# Patient Record
Sex: Female | Born: 1975 | Race: Black or African American | Hispanic: No | Marital: Married | Smoking: Never smoker
Health system: Southern US, Community
[De-identification: ages and names within clinical notes are randomized; demographics above are authoritative.]

## PROBLEM LIST (undated history)

## (undated) ENCOUNTER — Emergency Department (HOSPITAL_BASED_OUTPATIENT_CLINIC_OR_DEPARTMENT_OTHER): Payer: Self-pay

## (undated) DIAGNOSIS — D649 Anemia, unspecified: Secondary | ICD-10-CM

## (undated) DIAGNOSIS — B9689 Other specified bacterial agents as the cause of diseases classified elsewhere: Secondary | ICD-10-CM

## (undated) DIAGNOSIS — N920 Excessive and frequent menstruation with regular cycle: Secondary | ICD-10-CM

## (undated) DIAGNOSIS — N760 Acute vaginitis: Secondary | ICD-10-CM

## (undated) DIAGNOSIS — B009 Herpesviral infection, unspecified: Secondary | ICD-10-CM

## (undated) HISTORY — PX: TUBAL LIGATION: SHX77

## (undated) HISTORY — DX: Anemia, unspecified: D64.9

## (undated) HISTORY — DX: Other specified bacterial agents as the cause of diseases classified elsewhere: B96.89

## (undated) HISTORY — DX: Acute vaginitis: N76.0

## (undated) HISTORY — DX: Herpesviral infection, unspecified: B00.9

## (undated) HISTORY — DX: Excessive and frequent menstruation with regular cycle: N92.0

---

## 2012-06-09 ENCOUNTER — Telehealth: Payer: Self-pay | Admitting: Hematology & Oncology

## 2012-06-09 NOTE — Telephone Encounter (Signed)
Pt aware of 06-17-12 appointment

## 2012-06-12 ENCOUNTER — Other Ambulatory Visit: Payer: Self-pay

## 2012-06-17 ENCOUNTER — Ambulatory Visit: Payer: Self-pay | Admitting: Medical

## 2012-06-17 ENCOUNTER — Telehealth: Payer: Self-pay | Admitting: Hematology & Oncology

## 2012-06-17 ENCOUNTER — Ambulatory Visit: Payer: Self-pay

## 2012-06-17 ENCOUNTER — Other Ambulatory Visit: Payer: Self-pay | Admitting: Lab

## 2012-06-17 NOTE — Telephone Encounter (Signed)
Pt stated she called adult health to reaffirm appointment with [place] today. She said they told her she didn't have appointment. Pt aware of 06-24-12 appointment. She had all info from VM when she booked 06-17-12 appointnment

## 2012-06-24 ENCOUNTER — Other Ambulatory Visit: Payer: Self-pay | Admitting: Lab

## 2012-06-24 ENCOUNTER — Ambulatory Visit: Payer: Self-pay

## 2012-06-24 ENCOUNTER — Ambulatory Visit (HOSPITAL_BASED_OUTPATIENT_CLINIC_OR_DEPARTMENT_OTHER): Payer: Self-pay | Admitting: Medical

## 2012-06-24 VITALS — BP 122/64 | HR 96 | Temp 97.4°F | Resp 20 | Ht 61.0 in | Wt 231.0 lb

## 2012-06-24 DIAGNOSIS — N920 Excessive and frequent menstruation with regular cycle: Secondary | ICD-10-CM

## 2012-06-24 DIAGNOSIS — D509 Iron deficiency anemia, unspecified: Secondary | ICD-10-CM

## 2012-06-24 LAB — CBC WITH DIFFERENTIAL (CANCER CENTER ONLY)
BASO%: 0.2 % (ref 0.0–2.0)
Eosinophils Absolute: 0.1 10*3/uL (ref 0.0–0.5)
MCH: 21.6 pg — ABNORMAL LOW (ref 26.0–34.0)
MONO%: 7.8 % (ref 0.0–13.0)
NEUT#: 3.2 10*3/uL (ref 1.5–6.5)
Platelets: 450 10*3/uL — ABNORMAL HIGH (ref 145–400)
RBC: 2.91 10*6/uL — ABNORMAL LOW (ref 3.70–5.32)
WBC: 5 10*3/uL (ref 3.9–10.0)

## 2012-06-24 LAB — CHCC SATELLITE - SMEAR

## 2012-06-24 MED ORDER — SODIUM CHLORIDE 0.9 % IV SOLN
1020.0000 mg | Freq: Once | INTRAVENOUS | Status: DC
  Filled 2012-06-24: qty 34

## 2012-06-25 ENCOUNTER — Ambulatory Visit: Payer: Self-pay

## 2012-06-25 DIAGNOSIS — D509 Iron deficiency anemia, unspecified: Secondary | ICD-10-CM

## 2012-06-25 MED ORDER — SODIUM CHLORIDE 0.9 % IV SOLN
1020.0000 mg | Freq: Once | INTRAVENOUS | Status: DC
  Filled 2012-06-25: qty 34

## 2012-06-25 MED ORDER — SODIUM CHLORIDE 0.9 % IV SOLN: Freq: Once | INTRAVENOUS | Status: DC

## 2012-06-25 NOTE — Progress Notes (Signed)
06/25/2012 Iron infusion postponed due to lack of insurance coverage.  Wille Glaser spoke with patient at length, approximate cost $400.00.  Patient is going to speak with her insurance company and reschedule with Wille Glaser. Teola Bradley, Burhanuddin Kohlmann [employer]

## 2012-06-26 LAB — IRON AND TIBC
%SAT: 3 % — ABNORMAL LOW (ref 20–55)
Iron: 13 ug/dL — ABNORMAL LOW (ref 42–145)
TIBC: 386 ug/dL (ref 250–470)
UIBC: 373 ug/dL (ref 125–400)

## 2012-06-26 LAB — RETICULOCYTES (CHCC)
RBC.: 3.07 MIL/uL — ABNORMAL LOW (ref 3.87–5.11)
Retic Ct Pct: 1.1 % (ref 0.4–2.3)

## 2012-06-30 DIAGNOSIS — D509 Iron deficiency anemia, unspecified: Secondary | ICD-10-CM | POA: Insufficient documentation

## 2012-06-30 DIAGNOSIS — N920 Excessive and frequent menstruation with regular cycle: Secondary | ICD-10-CM | POA: Insufficient documentation

## 2012-06-30 NOTE — Progress Notes (Signed)
This office note has been dictated.

## 2012-06-30 NOTE — H&P (Signed)
[facility] NEW PATIENT EVALUATION   Name: Sara Porter Date: 06/30/2012 MRN: [id number] DOB: [birth year]    REFERRING PHYSICIAN:  Augustine Radar, FNP-C  REASON FOR REFERRAL: Iron deficiency anemia   HISTORY OF PRESENT ILLNESS:Sara Porter is a 36 y.o. female who was referred to [facility] for iron deficiency anemia.  Sara Porter apparently has a history of chronic iron deficiency anemia, along with menorrhagia.  She was previously being followed up with a gynecologist and hematologist and [place] regarding these problems, but unfortunately lost her insurance and was unable to keep her appointments.  Back on 05/14/2012, Sara Porter presented to the emergency room with fatigue.  In the emergency room.  She was found to have a hemoglobin of 6.  She was admitted and received 2 units of packed red blood cells.  Sara Porter was also having some problems with dysphagia and was seen a GI doctor in the past.  She reports, she underwent esophageal dilation last year.  One might suspect, that she has Plummer-Vinson syndrome given her history of chronic iron deficiency anemia along with dysphagia.  Sara Porter.  Also mentions that sickle cell trait runs in the patient's family, that she was never herself diagnosed with it.  Over the course of her hospital stay.  Her fatigue, resolved, and she felt much better after receiving 2 units of packed red blood cells.  Prior to her discharge.  She had a CBC.  Her hemoglobin was up to 7.7.  Sara Porter does report a chronic history of heavy, irregular cycles, especially, for the last 2-3 years.  She reports her menses usually lasts close to 2 weeks.  She was recently seen by Sara Porter at [facility] on 06/01/2012.  She was prescribed medroxyprogesterone oral tablets 5 mg to take when her menstrual cycle begins daily for 10 days.  Sara Porter reports, that since she has been on this medication.  She has  not noticed a difference in her current cycle.  Today.  Her hemoglobin is back, down at 6.3, hematocrit 21.4, MCV is 74, and platelets are 450,000.  Her ferritin level is 2, iron 13, with 3% saturation.  She clearly is iron deficient.  As such, we will go ahead and set her up to have IV Feraheme.  She currently reports, that she has an appointment with a gynecologist on September 20.  She denies any chest pain, shortness of breath.  She denies any dizziness or syncope.  She denies any cough, fevers, chills, night sweats.  She reports, her energy level is fair.  She denies any abdominal pain. She has a good appetite and denies any nausea, vomiting, diarrhea, or constipation.  She denies any headaches, visual changes, or any type of rashes.  She denies any type of bruising.   PAST MEDICAL HISTORY: Anemia, GERD, mnorrhagia,    PAST SURGICAL HISTORY: Tubal ligation  Current Outpatient Prescriptions on File Prior to Visit  Medication Sig Dispense Refill  . Iron 66 MG TABS Take 66 mg by mouth 3 (three) times daily.      . medroxyPROGESTERone (PROVERA) 2.5 MG tablet Take 2.5 mg by mouth 2 (two) times daily. Takes daily during minsterial cycle      . Omeprazole 20 MG TBEC Take 20 mg by mouth every morning.       No current facility-administered medications on file prior to visit.    ALLERGIES: NKDA  SOCIAL HISTORY: She is married.  She  currently does not wok.  She denies andy drug or alcohol use.    FAMILY HISTORY: unremarkable  Laboratory Data: White count 5.0, hemoglobin 6.3, hematocrit 21.4, MCV 74, platelets 450,000 Ferritin is 2, iron 13.3% saturation  Review of Systems: Pt. Denies any changes in their vision, hearing, adenopathy, fevers, chills, nausea, vomiting, diarrhea, constipation, chest pain, shortness of breath, passing blood, passing out, blacking out,  any changes in skin, joints, neurologic or psychiatric except as noted.  Physical Exam: This is a well-developed, well-nourished,  36 year old, African American, female, in no obvious distress Vitals: Temperature 97.4 degrees.  Pulse 96, respirations 20, blood pressure 122/64.  Weight 231 pounds HEENT reveals a normocephalic, atraumatic skull, no scleral icterus, no oral lesions  Neck is supple without any cervical or supraclavicular adenopathy.  Lungs are clear to auscultation bilaterally. There are no wheezes, rales or rhonci Cardiac is regular rate and rhythm with a normal S1 and S2. There are no murmurs, rubs, or bruits.  Abdomen is soft with good bowel sounds, there is no palpable mass. There is no palpable hepatosplenomegaly. There is no palpable fluid wave.  Musculoskeletal no tenderness of the spine, ribs, or hips.  Extremities there are no clubbing, cyanosis, or edema.  Skin no petechia, purpura or ecchymosis Neurologic is nonfocal.  Assessment/Plan: This is a pleasant, 36 year old, African American, female, with the following issues.  #1 .  Iron deficiency anemia, most likely secondary to heavy menses and menorrhagia.  She is obviously, iron deficient by her blood work.  As such, we will go ahead and given her IV iron with Feraheme 1,020mg . .  We are getting a hemoglobin electrophoresis to rule out any type of sickle cell disease.   #2 followup-I would like to see Sara Porter back in about 4 weeks to recheck his CBC.  We will see her back before then should she have questions or concerns.

## 2012-07-09 ENCOUNTER — Ambulatory Visit (INDEPENDENT_AMBULATORY_CARE_PROVIDER_SITE_OTHER): Payer: Self-pay | Admitting: Obstetrics & Gynecology

## 2012-07-09 ENCOUNTER — Encounter: Payer: Self-pay | Admitting: Obstetrics & Gynecology

## 2012-07-09 VITALS — BP 135/88 | HR 89 | Temp 97.6°F | Ht 61.25 in | Wt 227.5 lb

## 2012-07-09 DIAGNOSIS — N920 Excessive and frequent menstruation with regular cycle: Secondary | ICD-10-CM

## 2012-07-09 DIAGNOSIS — Z01812 Encounter for preprocedural laboratory examination: Secondary | ICD-10-CM

## 2012-07-09 DIAGNOSIS — N898 Other specified noninflammatory disorders of vagina: Secondary | ICD-10-CM

## 2012-07-09 NOTE — Progress Notes (Signed)
C/o heavy periods since started having children 16 years ago. States has irregular periods that last up to 2 1/2 weeks.

## 2012-07-09 NOTE — Progress Notes (Signed)
Patient ID: Sara Porter, female   DOB: 02/06/1976, 36 y.o.   MRN: [id number]  Chief Complaint  Patient presents with  . Menorrhagia    HPI Sara Porter is a 36 y.o. female.  [id number] Patient's last menstrual period was 06/15/2012. Patient has a history of heavy periods that made last for more than a week and her last period persisted or 2-1/2 weeks. She has been diagnosed with iron deficiency anemia and she has been transfused at [facility] on 1 occasion and has received IV iron as well. No bleeding today. Her last Pap smear was about a year ago she says she's never had an abnormal Pap smear. She has had Trichomonas and gonorrhea and Chlamydia in the past. She had a laparoscopic tubal ligation about 11 years ago. HPI  Past Medical History  Diagnosis Date  . Menorrhagia   . Herpes   . BV (bacterial vaginosis)   . Anemia     Past Surgical History  Procedure Date  . Tubal ligation     Family History  Problem Relation Age of Onset  . Hypertension Mother   . Cancer Father   . Anemia Daughter   . Anemia Daughter     Social History History  Substance Use Topics  . Smoking status: Never Smoker   . Smokeless tobacco: Never Used  . Alcohol Use: Yes     occasional social    No Known Allergies  Current Outpatient Prescriptions  Medication Sig Dispense Refill  . Iron 66 MG TABS Take 66 mg by mouth 3 (three) times daily.      . medroxyPROGESTERone (PROVERA) 2.5 MG tablet Take 2.5 mg by mouth 2 (two) times daily. Takes daily during minsterial cycle      . Omeprazole 20 MG TBEC Take 20 mg by mouth every morning.        Review of Systems Review of Systems  Constitutional: Negative.   Respiratory: Negative for shortness of breath.   Genitourinary: Positive for menstrual problem. Negative for dysuria, vaginal discharge and pelvic pain.    Blood pressure 135/88, pulse 89, temperature 97.6 F (36.4 C), height 5' 1.25" (1.556 m), weight 227 lb 8 oz  (103.193 kg), last menstrual period 06/15/2012.  Physical Exam Physical Exam  Constitutional: She appears well-developed. No distress.  Pulmonary/Chest: Effort normal. No respiratory distress.  Abdominal: Soft. She exhibits no distension and no mass. There is no tenderness.  Genitourinary: Vagina normal and uterus normal. No vaginal discharge found.       Pap test was deferred today. No adnexal masses or tenderness.  Skin: Skin is warm and dry.  Psychiatric: She has a normal mood and affect. Her behavior is normal.    Data Reviewed Office notes and labs CBC    Component Value Date/Time   WBC 5.0 06/24/2012 1435   HGB 6.3* 06/24/2012 1435   HCT 21.4* 06/24/2012 1435   PLT 450* 06/24/2012 1435   MCV 74* 06/24/2012 1435   MCH 21.6* 06/24/2012 1435   MCHC 29.4* 06/24/2012 1435   RDW 23.7* 06/24/2012 1435   LYMPHSABS 1.3 06/24/2012 1435   EOSABS 0.1 06/24/2012 1435   BASOSABS 0.0 06/24/2012 1435      Assessment    Menometrorrhagia and anemia    Plan    Schedule a pelvic ultrasound and she'll return after that and performed. STD probe was done today. She might be a candidate for endometrial ablation. If so preoperative endometrial biopsy would be performed  Sara Porter 07/09/2012, 2:26 PM

## 2012-07-09 NOTE — Patient Instructions (Signed)

## 2012-07-10 LAB — GC/CHLAMYDIA PROBE AMP, GENITAL: Chlamydia, DNA Probe: NEGATIVE

## 2012-07-15 ENCOUNTER — Ambulatory Visit: Payer: Self-pay | Admitting: Medical

## 2012-07-15 ENCOUNTER — Other Ambulatory Visit: Payer: Self-pay | Admitting: Lab

## 2012-07-17 ENCOUNTER — Ambulatory Visit (HOSPITAL_COMMUNITY)
Admission: RE | Admit: 2012-07-17 | Discharge: 2012-07-17 | Disposition: A | Discharge status: Home / Self Care | Payer: Self-pay | Source: Ambulatory Visit | Attending: Obstetrics & Gynecology | Admitting: Obstetrics & Gynecology

## 2012-07-17 DIAGNOSIS — N949 Unspecified condition associated with female genital organs and menstrual cycle: Secondary | ICD-10-CM | POA: Insufficient documentation

## 2012-07-17 DIAGNOSIS — Z01812 Encounter for preprocedural laboratory examination: Secondary | ICD-10-CM

## 2012-07-17 DIAGNOSIS — N898 Other specified noninflammatory disorders of vagina: Secondary | ICD-10-CM

## 2012-07-17 DIAGNOSIS — N920 Excessive and frequent menstruation with regular cycle: Secondary | ICD-10-CM | POA: Insufficient documentation

## 2012-11-28 ENCOUNTER — Other Ambulatory Visit: Payer: Self-pay

## 2012-12-21 ENCOUNTER — Telehealth: Payer: Self-pay | Admitting: *Deleted

## 2012-12-21 NOTE — Telephone Encounter (Signed)
Patient left a message stating that she had a question about a procedure. i returned her call and she stated that she had already spoke with Gaylyn Rong and her questions were answered.

## 2013-01-07 ENCOUNTER — Ambulatory Visit: Payer: Self-pay | Admitting: Obstetrics & Gynecology

## 2013-08-19 ENCOUNTER — Other Ambulatory Visit: Payer: Self-pay

## 2014-08-15 ENCOUNTER — Encounter: Payer: Self-pay | Admitting: Obstetrics & Gynecology

## 2016-09-01 ENCOUNTER — Encounter (HOSPITAL_BASED_OUTPATIENT_CLINIC_OR_DEPARTMENT_OTHER): Payer: Self-pay | Admitting: Emergency Medicine

## 2016-09-01 ENCOUNTER — Emergency Department (HOSPITAL_BASED_OUTPATIENT_CLINIC_OR_DEPARTMENT_OTHER)
Admission: EM | Admit: 2016-09-01 | Discharge: 2016-09-01 | Disposition: A | Discharge status: Home / Self Care | Payer: Medicaid Other | Attending: Emergency Medicine | Admitting: Emergency Medicine

## 2016-09-01 DIAGNOSIS — Z79899 Other long term (current) drug therapy: Secondary | ICD-10-CM | POA: Diagnosis not present

## 2016-09-01 DIAGNOSIS — R21 Rash and other nonspecific skin eruption: Secondary | ICD-10-CM

## 2016-09-01 DIAGNOSIS — L299 Pruritus, unspecified: Secondary | ICD-10-CM | POA: Diagnosis not present

## 2016-09-01 MED ORDER — TRIAMCINOLONE ACETONIDE 0.1 % EX CREA: 1.0000 "application " | TOPICAL_CREAM | Freq: Two times a day (BID) | CUTANEOUS | 0 refills | Status: DC

## 2016-09-01 NOTE — ED Provider Notes (Signed)
[facility]   CSN: [id number] Arrival date & time: 09/01/16  1459     History   Chief Complaint Chief Complaint  Patient presents with  . Rash    HPI Sara Porter is a 40 y.o. female.  HPI   Sara Porter is a 40 y.o. female, with a history of Herpes, presenting to the ED with an erythematous, pruritic rash to the right neck, shoulder, and upper back beginning last night. Patient denies fever, shortness of breath, cough, drainage, or any other complaints.    Past Medical History:  Diagnosis Date  . Anemia   . BV (bacterial vaginosis)   . Herpes   . Menorrhagia     Patient Active Problem List   Diagnosis Date Noted  . Iron deficiency anemia 06/30/2012  . Menorrhagia 06/30/2012    Past Surgical History:  Procedure Laterality Date  . TUBAL LIGATION      OB History    Gravida Para Term Preterm AB Living   4 4 4     4    SAB TAB Ectopic Multiple Live Births                   Home Medications    Prior to Admission medications   Medication Sig Start Date End Date Taking? Authorizing Provider  Iron 66 MG TABS Take 66 mg by mouth 3 (three) times daily.    Historical Provider, MD  medroxyPROGESTERone (PROVERA) 2.5 MG tablet Take 2.5 mg by mouth 2 (two) times daily. Takes daily during minsterial cycle    Historical Provider, MD  Omeprazole 20 MG TBEC Take 20 mg by mouth every morning.    Historical Provider, MD  triamcinolone cream (KENALOG) 0.1 % Apply 1 application topically 2 (two) times daily. 09/01/16   Anselm PancoastShawn C Joy, PA-C    Family History Family History  Problem Relation Age of Onset  . Hypertension Mother   . Cancer Father   . Anemia Daughter   . Anemia Daughter     Social History Social History  Substance Use Topics  . Smoking status: Never Smoker  . Smokeless tobacco: Never Used  . Alcohol use No     Allergies   Patient has no known allergies.   Review of Systems Review of Systems  Constitutional:  Negative for fever.  Skin: Positive for rash.     Physical Exam Updated Vital Signs BP 132/76 (BP Location: Left Arm)   Pulse 85   Temp 98.1 F (36.7 C) (Oral)   Resp 16   Ht 5\' 1"  (1.549 m)   Wt 122 kg   SpO2 100%   BMI 50.83 kg/m   Physical Exam  Constitutional: She appears well-developed and well-nourished. No distress.  HENT:  Head: Normocephalic and atraumatic.  Eyes: Conjunctivae are normal.  Neck: Neck supple.  Cardiovascular: Normal rate and regular rhythm.   Pulmonary/Chest: Effort normal.  Neurological: She is alert.  Skin: Skin is warm and dry. She is not diaphoretic.  Macular lesions on the right shoulder and upper right back without pustules or vesicles.   Psychiatric: She has a normal mood and affect. Her behavior is normal.  Nursing note and vitals reviewed.    ED Treatments / Results  Labs (all labs ordered are listed, but only abnormal results are displayed) Labs Reviewed - No data to display  EKG  EKG Interpretation None       Radiology No results found.  Procedures Procedures (including critical care time)  Medications Ordered in ED Medications - No data to display   Initial Impression / Assessment and Plan / ED Course  I have reviewed the triage vital signs and the nursing notes.  Pertinent labs & imaging results that were available during my care of the patient were reviewed by me and considered in my medical decision making (see chart for details).  Clinical Course     Patient presents with a pruritic rash beginning last night. Rash may be due to insect bites or contact dermatitis. Therapy recommended. PCP follow-up should symptoms fail to resolve.    Final Clinical Impressions(s) / ED Diagnoses   Final diagnoses:  Rash    New Prescriptions Discharge Medication List as of 09/01/2016  4:03 PM    START taking these medications   Details  triamcinolone cream (KENALOG) 0.1 % Apply 1 application topically 2 (two) times  daily., Starting Sun 09/01/2016, Print         Anselm PancoastShawn C Joy, PA-C 09/01/16 1615    Sara MondayErin Schlossman, MD 09/02/16 2119

## 2016-09-01 NOTE — Discharge Instructions (Signed)
Use the triamcinolone cream 2 times daily as needed for itchy rash. Follow-up with your PCP should symptoms fail to resolve.

## 2016-09-01 NOTE — ED Notes (Signed)
Pt c/o raised red bumps she noticed last night while at work.  Pt denies pain but states bumps are very itchy.  Pt denies known exposure to any allergen or insects.

## 2016-09-01 NOTE — ED Triage Notes (Signed)
Patient reports rash to right side of neck, right shoulder and right upper arm which began last night.  Describes the rash as pruritic.  Noted to have raised erythematous patches to right neck, shoulder and arm.

## 2016-12-05 ENCOUNTER — Encounter (HOSPITAL_BASED_OUTPATIENT_CLINIC_OR_DEPARTMENT_OTHER): Payer: Self-pay | Admitting: *Deleted

## 2016-12-05 ENCOUNTER — Emergency Department (HOSPITAL_BASED_OUTPATIENT_CLINIC_OR_DEPARTMENT_OTHER)
Admission: EM | Admit: 2016-12-05 | Discharge: 2016-12-05 | Disposition: A | Discharge status: Home / Self Care | Payer: Medicaid Other | Attending: Emergency Medicine | Admitting: Emergency Medicine

## 2016-12-05 DIAGNOSIS — Y939 Activity, unspecified: Secondary | ICD-10-CM | POA: Insufficient documentation

## 2016-12-05 DIAGNOSIS — S40861A Insect bite (nonvenomous) of right upper arm, initial encounter: Secondary | ICD-10-CM | POA: Insufficient documentation

## 2016-12-05 DIAGNOSIS — Z79899 Other long term (current) drug therapy: Secondary | ICD-10-CM | POA: Insufficient documentation

## 2016-12-05 DIAGNOSIS — Y999 Unspecified external cause status: Secondary | ICD-10-CM | POA: Insufficient documentation

## 2016-12-05 DIAGNOSIS — Y929 Unspecified place or not applicable: Secondary | ICD-10-CM | POA: Insufficient documentation

## 2016-12-05 DIAGNOSIS — W57XXXA Bitten or stung by nonvenomous insect and other nonvenomous arthropods, initial encounter: Secondary | ICD-10-CM | POA: Insufficient documentation

## 2016-12-05 MED ORDER — HYDROCORTISONE 1 % EX CREA: TOPICAL_CREAM | CUTANEOUS | 0 refills | Status: DC

## 2016-12-05 MED ORDER — DIPHENHYDRAMINE HCL 25 MG PO CAPS
ORAL_CAPSULE | ORAL | Status: AC
  Administered 2016-12-05: 25 mg
  Filled 2016-12-05: qty 1

## 2016-12-05 MED FILL — HYDROCORTISONE 1% CREAM: 1 | 2 days supply | Qty: 28 | Fill #0

## 2016-12-05 NOTE — ED Provider Notes (Signed)
[facility]   CSN: [id number] Arrival date & time: 12/05/16  1004     History   Chief Complaint Chief Complaint  Patient presents with  . Rash    HPI Ginny ForthLachiesha Boden is a 41 y.o. female.  The history is provided by the patient.  Rash   This is a new problem. The current episode started 1 to 2 hours ago. The problem has been gradually worsening. The problem is associated with an insect bite/sting. There has been no fever. The rash is present on the right arm. The pain is at a severity of 1/10. The pain is mild. The pain has been constant since onset. Associated symptoms include itching and pain. She has tried nothing for the symptoms. The treatment provided no relief. Risk factors: Her brother recently have bed bugs but he has been in her car but she has not been his home and no new animals.    Past Medical History:  Diagnosis Date  . Anemia   . BV (bacterial vaginosis)   . Herpes   . Menorrhagia     Patient Active Problem List   Diagnosis Date Noted  . Iron deficiency anemia 06/30/2012  . Menorrhagia 06/30/2012    Past Surgical History:  Procedure Laterality Date  . TUBAL LIGATION      OB History    Gravida Para Term Preterm AB Living   4 4 4     4    SAB TAB Ectopic Multiple Live Births                   Home Medications    Prior to Admission medications   Medication Sig Start Date End Date Taking? Authorizing Provider  medroxyPROGESTERone (PROVERA) 2.5 MG tablet Take 2.5 mg by mouth 2 (two) times daily. Takes daily during minsterial cycle   Yes Historical Provider, MD  Omeprazole 20 MG TBEC Take 20 mg by mouth every morning.   Yes Historical Provider, MD  hydrocortisone cream 1 % Apply to affected area 2 times daily 12/05/16   Gwyneth SproutWhitney Jencarlo Bonadonna, MD  Iron 66 MG TABS Take 66 mg by mouth 3 (three) times daily.    Historical Provider, MD  triamcinolone cream (KENALOG) 0.1 % Apply 1 application topically 2 (two) times daily. 09/01/16    Anselm PancoastShawn C Joy, PA-C    Family History Family History  Problem Relation Age of Onset  . Hypertension Mother   . Cancer Father   . Anemia Daughter   . Anemia Daughter     Social History Social History  Substance Use Topics  . Smoking status: Never Smoker  . Smokeless tobacco: Never Used  . Alcohol use No     Allergies   Patient has no known allergies.   Review of Systems Review of Systems  Skin: Positive for itching and rash.  All other systems reviewed and are negative.    Physical Exam Updated Vital Signs BP 159/90   Pulse 92   Temp 98.2 F (36.8 C) (Oral)   Resp 20   Ht 5\' 2"  (1.575 m)   Wt 260 lb (117.9 kg)   LMP 11/14/2016   SpO2 100%   BMI 47.55 kg/m   Physical Exam  Constitutional: She appears well-developed and well-nourished. No distress.  HENT:  Head: Normocephalic and atraumatic.  Cardiovascular: Normal rate.   Pulmonary/Chest: Effort normal.  Skin: Skin is warm. Capillary refill takes less than 2 seconds. There is erythema.     Psychiatric: She has  a normal mood and affect. Her behavior is normal.  Nursing note and vitals reviewed.    ED Treatments / Results  Labs (all labs ordered are listed, but only abnormal results are displayed) Labs Reviewed - No data to display  EKG  EKG Interpretation None       Radiology No results found.  Procedures Procedures (including critical care time)  Medications Ordered in ED Medications  diphenhydrAMINE (BENADRYL) 25 mg capsule (25 mg  Given 12/05/16 1058)     Initial Impression / Assessment and Plan / ED Course  I have reviewed the triage vital signs and the nursing notes.  Pertinent labs & imaging results that were available during my care of the patient were reviewed by me and considered in my medical decision making (see chart for details).     Patient presenting here today with symptoms most consistent with an insect bite with localized reaction. No anaphylaxis or signs of  infection. Recommended hydrocortisone cream and Benadryl when necessary. Final Clinical Impressions(s) / ED Diagnoses   Final diagnoses:  Insect bite, initial encounter    New Prescriptions New Prescriptions   HYDROCORTISONE CREAM 1 %    Apply to affected area 2 times daily     Gwyneth Sprout, MD 12/05/16 1144

## 2016-12-05 NOTE — ED Triage Notes (Signed)
States she thinks she was bit by an insect this am. Itching and hive to her right upper inner arm.

## 2017-09-13 ENCOUNTER — Emergency Department (HOSPITAL_BASED_OUTPATIENT_CLINIC_OR_DEPARTMENT_OTHER): Payer: Self-pay

## 2017-09-13 ENCOUNTER — Encounter (HOSPITAL_BASED_OUTPATIENT_CLINIC_OR_DEPARTMENT_OTHER): Payer: Self-pay | Admitting: *Deleted

## 2017-09-13 ENCOUNTER — Emergency Department (HOSPITAL_BASED_OUTPATIENT_CLINIC_OR_DEPARTMENT_OTHER)
Admission: EM | Admit: 2017-09-13 | Discharge: 2017-09-13 | Disposition: A | Discharge status: Home / Self Care | Payer: Self-pay | Attending: Emergency Medicine | Admitting: Emergency Medicine

## 2017-09-13 DIAGNOSIS — R05 Cough: Secondary | ICD-10-CM | POA: Insufficient documentation

## 2017-09-13 DIAGNOSIS — J029 Acute pharyngitis, unspecified: Secondary | ICD-10-CM

## 2017-09-13 DIAGNOSIS — R0602 Shortness of breath: Secondary | ICD-10-CM | POA: Insufficient documentation

## 2017-09-13 DIAGNOSIS — H9202 Otalgia, left ear: Secondary | ICD-10-CM

## 2017-09-13 DIAGNOSIS — J209 Acute bronchitis, unspecified: Secondary | ICD-10-CM

## 2017-09-13 DIAGNOSIS — R0981 Nasal congestion: Secondary | ICD-10-CM | POA: Insufficient documentation

## 2017-09-13 DIAGNOSIS — R059 Cough, unspecified: Secondary | ICD-10-CM

## 2017-09-13 LAB — RAPID STREP SCREEN (MED CTR MEBANE ONLY): STREPTOCOCCUS, GROUP A SCREEN (DIRECT): NEGATIVE

## 2017-09-13 MED ORDER — ALBUTEROL SULFATE HFA 108 (90 BASE) MCG/ACT IN AERS
2.0000 | INHALATION_SPRAY | RESPIRATORY_TRACT | Status: DC | PRN
  Administered 2017-09-13: 2 via RESPIRATORY_TRACT

## 2017-09-13 MED ORDER — AZITHROMYCIN 250 MG PO TABS: ORAL_TABLET | ORAL | 0 refills | Status: DC

## 2017-09-13 MED ORDER — KETOROLAC TROMETHAMINE 60 MG/2ML IM SOLN
30.0000 mg | Freq: Once | INTRAMUSCULAR | Status: AC
  Administered 2017-09-13: 30 mg via INTRAMUSCULAR
  Filled 2017-09-13: qty 2

## 2017-09-13 MED ORDER — ALBUTEROL SULFATE HFA 108 (90 BASE) MCG/ACT IN AERS
INHALATION_SPRAY | RESPIRATORY_TRACT | Status: AC
  Filled 2017-09-13: qty 6.7

## 2017-09-13 NOTE — ED Provider Notes (Signed)
[facility] Provider Note: Lowella DellJ. Lane Shayonna Ocampo, MD, FACEP  CSN: [id number] MRN: [id number] ARRIVAL: 09/13/17 at 0218 ROOM: MH11/[facility]11   CHIEF COMPLAINT  Ear Pain   HISTORY OF PRESENT ILLNESS  09/13/17 5:45 AM Sara Porter is a 41 y.o. female with a one-week history of sore throat, left earache, cough, nasal congestion and shortness of breath.  [facility] and diagnosed with an upper respiratory infection.  She was prescribed ibuprofen.  She states her symptoms are worsening and she rates her ear pain and throat pain as a 10 out of 10.  She states her throat hurts so bad she is unable to swallow.  Her cough and shortness of breath have worsened.  She has been putting sweet oil in her left ear without relief.  Past Medical History:  Diagnosis Date  . Anemia   . BV (bacterial vaginosis)   . Herpes   . Menorrhagia     Past Surgical History:  Procedure Laterality Date  . TUBAL LIGATION      Family History  Problem Relation Age of Onset  . Hypertension Mother   . Cancer Father   . Anemia Daughter   . Anemia Daughter     Social History   Tobacco Use  . Smoking status: Never Smoker  . Smokeless tobacco: Never Used  Substance Use Topics  . Alcohol use: No  . Drug use: No    Prior to Admission medications   Medication Sig Start Date End Date Taking? Authorizing Provider  hydrocortisone cream 1 % Apply to affected area 2 times daily 12/05/16   Gwyneth SproutPlunkett, Whitney, MD  Iron 66 MG TABS Take 66 mg by mouth 3 (three) times daily.    [provider]  medroxyPROGESTERone (PROVERA) 2.5 MG tablet Take 2.5 mg by mouth 2 (two) times daily. Takes daily during minsterial cycle    [provider]  Omeprazole 20 MG TBEC Take 20 mg by mouth every morning.    [provider]  triamcinolone cream (KENALOG) 0.1 % Apply 1 application topically 2 (two) times daily. 09/01/16   Joy, Hillard DankerShawn C, PA-C    Allergies Patient has  no known allergies.   REVIEW OF SYSTEMS  Negative except as noted here or in the History of Present Illness.   PHYSICAL EXAMINATION  Initial Vital Signs Blood pressure 132/89, pulse 89, temperature 98.3 F (36.8 C), temperature source Oral, resp. rate 20, height 5\' 2"  (1.575 m), weight 122.9 kg (271 lb), last menstrual period 08/14/2017, SpO2 100 %.  Examination General: Well-developed, well-nourished female in no acute distress; appearance consistent with age of record HENT: normocephalic; atraumatic; no pharyngeal erythema or exudate; no dysphonia; no stridor; no trismus; uvula midline; TMs normal bilaterally Eyes: pupils equal, round and reactive to light; extraocular muscles intact Neck: supple Heart: regular rate and rhythm Lungs: Decreased air movement bilaterally; dry cough Abdomen: soft; nondistended; nontender; bowel sounds present Extremities: No deformity; full range of motion; pulses normal Neurologic: Awake, alert and oriented; motor function intact in all extremities and symmetric; no facial droop Skin: Warm and dry Psychiatric: Normal mood and affect   RESULTS  Summary of this visit's results, reviewed by myself:   EKG Interpretation  Date/Time:    Ventricular Rate:    PR Interval:    QRS Duration:   QT Interval:    QTC Calculation:   R Axis:     Text Interpretation:        Laboratory  Studies: Results for orders placed or performed during the hospital encounter of 09/13/17 (from the past 24 hour(s))  Rapid strep screen     Status: None   Collection Time: 09/13/17  2:52 AM  Result Value Ref Range   Streptococcus, Group A Screen (Direct) NEGATIVE NEGATIVE   Imaging Studies: Dg Chest 2 View  Result Date: 09/13/2017 CLINICAL DATA:  Cough. EXAM: CHEST  2 VIEW COMPARISON:  Radiographs 10/11/2015 FINDINGS: The cardiomediastinal contours are normal. The lungs are clear. Pulmonary vasculature is normal. No consolidation, pleural effusion, or pneumothorax. No  acute osseous abnormalities are seen. IMPRESSION: No acute pulmonary process. Electronically Signed   By: Rubye OaksMelanie  Ehinger M.D.   On: 09/13/2017 03:20    ED COURSE  Nursing notes and initial vitals signs, including pulse oximetry, reviewed.  Vitals:   09/13/17 0246 09/13/17 0250  BP:  132/89  Pulse:  89  Resp:  20  Temp:  98.3 F (36.8 C)  TempSrc:  Oral  SpO2:  100%  Weight: 122.9 kg (271 lb)   Height: 5\' 2"  (1.575 m)     PROCEDURES    ED DIAGNOSES     ICD-10-CM   1. Acute bronchitis with bronchospasm J20.9   2. Cough R05 DG Chest 2 View    DG Chest 2 View  3. Sore throat J02.9   4. Ear pain, left H92.02        Nyomie Ehrlich, Jonny RuizJohn, MD 09/13/17 [phone]

## 2017-09-13 NOTE — ED Notes (Signed)
Pt discharged to home with family. NAD.  

## 2017-09-13 NOTE — ED Triage Notes (Signed)
C/o left ear pain , cough, congestion , throat pain x 5 days  [facility] same

## 2017-09-16 LAB — CULTURE, GROUP A STREP (THRC)

## 2017-11-21 ENCOUNTER — Encounter (HOSPITAL_BASED_OUTPATIENT_CLINIC_OR_DEPARTMENT_OTHER): Payer: Self-pay | Admitting: *Deleted

## 2017-11-21 ENCOUNTER — Emergency Department (HOSPITAL_BASED_OUTPATIENT_CLINIC_OR_DEPARTMENT_OTHER): Payer: BLUE CROSS/BLUE SHIELD

## 2017-11-21 ENCOUNTER — Other Ambulatory Visit: Payer: Self-pay

## 2017-11-21 ENCOUNTER — Emergency Department (HOSPITAL_BASED_OUTPATIENT_CLINIC_OR_DEPARTMENT_OTHER)
Admission: EM | Admit: 2017-11-21 | Discharge: 2017-11-21 | Disposition: A | Discharge status: Home / Self Care | Payer: BLUE CROSS/BLUE SHIELD | Attending: Emergency Medicine | Admitting: Emergency Medicine

## 2017-11-21 DIAGNOSIS — R079 Chest pain, unspecified: Secondary | ICD-10-CM | POA: Diagnosis present

## 2017-11-21 DIAGNOSIS — R0789 Other chest pain: Secondary | ICD-10-CM | POA: Insufficient documentation

## 2017-11-21 DIAGNOSIS — R2243 Localized swelling, mass and lump, lower limb, bilateral: Secondary | ICD-10-CM | POA: Insufficient documentation

## 2017-11-21 DIAGNOSIS — R0989 Other specified symptoms and signs involving the circulatory and respiratory systems: Secondary | ICD-10-CM | POA: Diagnosis not present

## 2017-11-21 DIAGNOSIS — D649 Anemia, unspecified: Secondary | ICD-10-CM | POA: Insufficient documentation

## 2017-11-21 LAB — COMPREHENSIVE METABOLIC PANEL
ALT: 13 U/L — ABNORMAL LOW (ref 14–54)
AST: 16 U/L (ref 15–41)
Albumin: 3.6 g/dL (ref 3.5–5.0)
Alkaline Phosphatase: 85 U/L (ref 38–126)
Anion gap: 6 (ref 5–15)
BUN: 18 mg/dL (ref 6–20)
CHLORIDE: 106 mmol/L (ref 101–111)
CO2: 25 mmol/L (ref 22–32)
CREATININE: 0.69 mg/dL (ref 0.44–1.00)
Calcium: 8.6 mg/dL — ABNORMAL LOW (ref 8.9–10.3)
GFR calc Af Amer: >60 mL/min (ref >60)
Glucose, Bld: 94 mg/dL (ref 65–99)
POTASSIUM: 3.5 mmol/L (ref 3.5–5.1)
Sodium: 137 mmol/L (ref 135–145)
TOTAL PROTEIN: 8.1 g/dL (ref 6.5–8.1)
Total Bilirubin: 0.3 mg/dL (ref 0.3–1.2)

## 2017-11-21 LAB — CBC WITH DIFFERENTIAL/PLATELET
Basophils Absolute: 0 10*3/uL (ref 0.0–0.1)
Basophils Relative: 0 %
EOS PCT: 4 %
Eosinophils Absolute: 0.2 10*3/uL (ref 0.0–0.7)
HCT: 34.3 % — ABNORMAL LOW (ref 36.0–46.0)
Hemoglobin: 11.2 g/dL — ABNORMAL LOW (ref 12.0–15.0)
LYMPHS ABS: 1.1 10*3/uL (ref 0.7–4.0)
LYMPHS PCT: 32 %
MCH: 27 pg (ref 26.0–34.0)
MCHC: 32.7 g/dL (ref 30.0–36.0)
MCV: 82.7 fL (ref 78.0–100.0)
MONO ABS: 0.4 10*3/uL (ref 0.1–1.0)
Monocytes Relative: 11 %
Neutro Abs: 1.9 10*3/uL (ref 1.7–7.7)
Neutrophils Relative %: 53 %
PLATELETS: 309 10*3/uL (ref 150–400)
RBC: 4.15 MIL/uL (ref 3.87–5.11)
RDW: 14 % (ref 11.5–15.5)
WBC: 3.5 10*3/uL — ABNORMAL LOW (ref 4.0–10.5)

## 2017-11-21 LAB — TROPONIN I

## 2017-11-21 LAB — BRAIN NATRIURETIC PEPTIDE: B Natriuretic Peptide: 4.5 pg/mL (ref 0.0–100.0)

## 2017-11-21 LAB — D-DIMER, QUANTITATIVE: D-Dimer, Quant: 0.34 ug/mL-FEU (ref 0.00–0.50)

## 2017-11-21 MED ORDER — METHOCARBAMOL 500 MG PO TABS: 1000.0000 mg | ORAL_TABLET | Freq: Three times a day (TID) | ORAL | 0 refills | Status: DC | PRN

## 2017-11-21 MED ORDER — KETOROLAC TROMETHAMINE 30 MG/ML IJ SOLN
30.0000 mg | Freq: Once | INTRAMUSCULAR | Status: AC
  Administered 2017-11-21: 30 mg via INTRAVENOUS
  Filled 2017-11-21: qty 1

## 2017-11-21 MED ORDER — ACETAMINOPHEN 500 MG PO TABS
1000.0000 mg | ORAL_TABLET | Freq: Once | ORAL | Status: DC
  Filled 2017-11-21: qty 2

## 2017-11-21 MED ORDER — IBUPROFEN 600 MG PO TABS: 600.0000 mg | ORAL_TABLET | Freq: Four times a day (QID) | ORAL | 0 refills | Status: DC | PRN

## 2017-11-21 MED FILL — IBUPROFEN 600 MG TABLET: 600 | 7 days supply | Qty: 30 | Fill #0

## 2017-11-21 MED FILL — METHOCARBAMOL 500 MG TABS: 500 | 5 days supply | Qty: 30 | Fill #0

## 2017-11-21 NOTE — ED Triage Notes (Signed)
Pt c/o right side chest pain x2days. She denies increase in pain with movement or deep inspiration.

## 2017-11-21 NOTE — ED Provider Notes (Signed)
[facility] EMERGENCY DEPARTMENT Provider Note   CSN: [id number] Arrival date & time: 11/21/17  7829     History   Chief Complaint Chief Complaint  Patient presents with  . Chest Pain    HPI Sara Porter is a 42 y.o. female.  HPI Patient presents with 2 days of right-sided chest pain.  States the pain started while she was at work 2 days ago.  Describes the pain is sharp.  States the pain to be works while at work and then improves when she goes to sleep.  She denies any shortness of breath.  No fever or chills.  No cough.  She has chronic lower extremity swelling but no recent changes.  No recent extended travel or immobilization. Past Medical History:  Diagnosis Date  . Anemia   . BV (bacterial vaginosis)   . Herpes   . Menorrhagia     Patient Active Problem List   Diagnosis Date Noted  . Iron deficiency anemia 06/30/2012  . Menorrhagia 06/30/2012    Past Surgical History:  Procedure Laterality Date  . TUBAL LIGATION      OB History    Gravida Para Term Preterm AB Living   4 4 4     4    SAB TAB Ectopic Multiple Live Births                   Home Medications    Prior to Admission medications   Medication Sig Start Date End Date Taking? Authorizing Provider  azithromycin (ZITHROMAX Z-PAK) 250 MG tablet Take 2 tablets today then 1 tablet daily for 4 days.  May crush to make easier to swallow. 09/13/17   Molpus, John, MD  ibuprofen (ADVIL,MOTRIN) 600 MG tablet Take 1 tablet (600 mg total) by mouth every 6 (six) hours as needed for moderate pain. 11/21/17   Loren Racer, MD  methocarbamol (ROBAXIN) 500 MG tablet Take 2 tablets (1,000 mg total) by mouth every 8 (eight) hours as needed for muscle spasms. 11/21/17   Loren Racer, MD  Omeprazole 20 MG TBEC Take 20 mg by mouth every morning.    [provider]    Family History Family History  Problem Relation Age of Onset  . Hypertension Mother   . Cancer Father   . Anemia Daughter     . Anemia Daughter     Social History Social History   Tobacco Use  . Smoking status: Never Smoker  . Smokeless tobacco: Never Used  Substance Use Topics  . Alcohol use: No  . Drug use: No     Allergies   Patient has no known allergies.   Review of Systems Review of Systems  Constitutional: Negative for chills and fever.  HENT: Negative for congestion, sinus pressure and sore throat.   Respiratory: Negative for cough and shortness of breath.   Cardiovascular: Positive for chest pain and leg swelling. Negative for palpitations.  Gastrointestinal: Negative for abdominal pain, diarrhea, nausea and vomiting.  Musculoskeletal: Negative for back pain, myalgias, neck pain and neck stiffness.  Skin: Negative for rash and wound.  Neurological: Negative for dizziness, weakness, light-headedness, numbness and headaches.  All other systems reviewed and are negative.    Physical Exam Updated Vital Signs BP 132/83   Pulse 83   Temp 98.8 F (37.1 C) (Oral)   Resp 20   Ht 5\' 2"  (1.575 m)   Wt 79.4 kg (175 lb)   LMP 10/20/2017 (Approximate)   SpO2 100%   BMI  32.01 kg/m   Physical Exam  Constitutional: She is oriented to person, place, and time. She appears well-developed and well-nourished.  HENT:  Head: Normocephalic and atraumatic.  Mouth/Throat: Oropharynx is clear and moist. No oropharyngeal exudate.  Eyes: EOM are normal. Pupils are equal, round, and reactive to light.  Neck: Normal range of motion. Neck supple.  Cardiovascular: Normal rate and regular rhythm. Exam reveals no gallop and no friction rub.  No murmur heard. Pulmonary/Chest: Effort normal. She has rales. She exhibits tenderness.  Patient has a few rales in the right base.  Pain is reproduced with palpation of the right upper chest.  There is no crepitance or deformity.  Abdominal: Soft. Bowel sounds are normal. There is no tenderness. There is no rebound and no guarding.  Musculoskeletal: Normal range of  motion. She exhibits no edema or tenderness.  Mild bilateral lower extremity swelling but no pitting edema.  No calf tenderness or asymmetry.  Distal pulses are 2+.  Neurological: She is alert and oriented to person, place, and time.  Moves all extremities without deficit.  Sensation intact.  Skin: Skin is warm and dry. Capillary refill takes less than 2 seconds. No rash noted. No erythema.  Psychiatric: She has a normal mood and affect. Her behavior is normal.  Nursing note and vitals reviewed.    ED Treatments / Results  Labs (all labs ordered are listed, but only abnormal results are displayed) Labs Reviewed  CBC WITH DIFFERENTIAL/PLATELET - Abnormal; Notable for the following components:      Result Value   WBC 3.5 (*)    Hemoglobin 11.2 (*)    HCT 34.3 (*)    All other components within normal limits  COMPREHENSIVE METABOLIC PANEL - Abnormal; Notable for the following components:   Calcium 8.6 (*)    ALT 13 (*)    All other components within normal limits  BRAIN NATRIURETIC PEPTIDE  TROPONIN I  D-DIMER, QUANTITATIVE (NOT AT [facility])    EKG  EKG Interpretation  Date/Time:  Friday November 21 2017 08:45:32 EST Ventricular Rate:  84 PR Interval:  154 QRS Duration: 84 QT Interval:  384 QTC Calculation: 453 R Axis:   80 Text Interpretation:  Normal sinus rhythm with sinus arrhythmia Normal ECG Confirmed by Loren RacerYelverton, Brunella Wileman ([id number]) on 11/21/2017 11:34:53 AM       Radiology No results found.  Procedures Procedures (including critical care time)  Medications Ordered in ED Medications  ketorolac (TORADOL) 30 MG/ML injection 30 mg (30 mg Intravenous Given 11/21/17 1348)     Initial Impression / Assessment and Plan / ED Course  I have reviewed the triage vital signs and the nursing notes.  Pertinent labs & imaging results that were available during my care of the patient were reviewed by me and considered in my medical decision making (see chart for details).      Patient with right-sided chest pain.  Reproduced with palpation.  Troponin x2 is normal.  Normal d-dimer.  Will treat symptomatically.  Final Clinical Impressions(s) / ED Diagnoses   Final diagnoses:  Chest wall pain    ED Discharge Orders        Ordered    ibuprofen (ADVIL,MOTRIN) 600 MG tablet  Every 6 hours PRN     11/21/17 1333    methocarbamol (ROBAXIN) 500 MG tablet  Every 8 hours PRN     11/21/17 1333       Loren RacerYelverton, Shenica Holzheimer, MD 11/24/17 1611

## 2018-07-06 ENCOUNTER — Other Ambulatory Visit: Payer: Self-pay

## 2018-07-06 ENCOUNTER — Encounter (HOSPITAL_BASED_OUTPATIENT_CLINIC_OR_DEPARTMENT_OTHER): Payer: Self-pay | Admitting: Emergency Medicine

## 2018-07-06 ENCOUNTER — Emergency Department (HOSPITAL_BASED_OUTPATIENT_CLINIC_OR_DEPARTMENT_OTHER)
Admission: EM | Admit: 2018-07-06 | Discharge: 2018-07-06 | Disposition: A | Discharge status: Home / Self Care | Payer: BLUE CROSS/BLUE SHIELD | Attending: Emergency Medicine | Admitting: Emergency Medicine

## 2018-07-06 DIAGNOSIS — M542 Cervicalgia: Secondary | ICD-10-CM | POA: Insufficient documentation

## 2018-07-06 DIAGNOSIS — Z79899 Other long term (current) drug therapy: Secondary | ICD-10-CM | POA: Insufficient documentation

## 2018-07-06 MED ORDER — IBUPROFEN 600 MG PO TABS: 600.0000 mg | ORAL_TABLET | Freq: Three times a day (TID) | ORAL | 0 refills | Status: AC | PRN

## 2018-07-06 MED ORDER — IBUPROFEN 400 MG PO TABS
600.0000 mg | ORAL_TABLET | Freq: Once | ORAL | Status: AC
  Administered 2018-07-06: 600 mg via ORAL
  Filled 2018-07-06: qty 1

## 2018-07-06 MED FILL — IBUPROFEN 600 MG TABLET: 600 | 7 days supply | Qty: 20 | Fill #0

## 2018-07-06 NOTE — ED Triage Notes (Signed)
Pt having left sided neck pain that radiates to ear.  Pt denies injury or fever.  Pt states it started about one week but seems to be getting worse and now has headache.

## 2018-07-06 NOTE — ED Provider Notes (Signed)
[facility] EMERGENCY DEPARTMENT Provider Note   CSN: [id number] Arrival date & time: 07/06/18  [id number]     History   Chief Complaint Chief Complaint  Patient presents with  . Neck Pain    HPI Sara ForthLachiesha Porter is a 42 y.o. female.  Patient c/o left side of neck pain, laterally, for the past few days. Denies injury to area. Symptoms mild-moderate, constant, persistent, non radiating. Denies ear pain. No headache. No dental pain or pain w opening and closing mouth or chewing. Denies posterior neck pain. No radicular pain. No skin changes or redness. No fever or chills. No sore throat. No trouble breathing or swallowing. States happened once in past and was relieved with anti-inflamm type medication.   The history is provided by the patient.  Neck Pain   Pertinent negatives include no chest pain, no numbness, no headaches and no weakness.  Headache   Pertinent negatives include no fever and no shortness of breath.    Past Medical History:  Diagnosis Date  . Anemia   . BV (bacterial vaginosis)   . Herpes   . Menorrhagia     Patient Active Problem List   Diagnosis Date Noted  . Iron deficiency anemia 06/30/2012  . Menorrhagia 06/30/2012    Past Surgical History:  Procedure Laterality Date  . TUBAL LIGATION       OB History    Gravida  4   Para  4   Term  4   Preterm      AB      Living  4     SAB      TAB      Ectopic      Multiple      Live Births               Home Medications    Prior to Admission medications   Medication Sig Start Date End Date Taking? Authorizing Provider  Ibuprofen-diphenhydrAMINE Cit 200-38 MG TABS Take by mouth.    [provider]  Omeprazole 20 MG TBEC Take 20 mg by mouth every morning.    [provider]    Family History Family History  Problem Relation Age of Onset  . Hypertension Mother   . Cancer Father   . Anemia Daughter   . Anemia Daughter     Social History Social  History   Tobacco Use  . Smoking status: Never Smoker  . Smokeless tobacco: Never Used  Substance Use Topics  . Alcohol use: No  . Drug use: No     Allergies   Patient has no known allergies.   Review of Systems Review of Systems  Constitutional: Negative for chills and fever.  HENT: Negative for sore throat and trouble swallowing.   Eyes: Negative for redness.  Respiratory: Negative for shortness of breath.   Cardiovascular: Negative for chest pain.  Gastrointestinal: Negative for abdominal pain.  Genitourinary: Negative for flank pain.  Musculoskeletal: Positive for neck pain. Negative for back pain.  Skin: Negative for rash.  Neurological: Negative for weakness, numbness and headaches.  Hematological: Does not bruise/bleed easily.  Psychiatric/Behavioral: Negative for confusion.     Physical Exam Updated Vital Signs BP (!) 153/84 (BP Location: Right Arm)   Pulse 90   Temp 99.5 F (37.5 C) (Oral)   Resp 18   Ht 1.549 m (5\' 1" )   Wt 125.2 kg   LMP 06/19/2018   SpO2 100%   BMI 52.15 kg/m   Physical  Exam  Constitutional: She appears well-developed and well-nourished.  HENT:  Head: Atraumatic.  Mouth/Throat: Oropharynx is clear and moist.  No oral/dental infection. No trismus. Pharynx normal. No swelling. No facial or parotid mass. ?minimal preauricular l/a. Left eac and tm normal.   Eyes: Conjunctivae are normal. No scleral icterus.  Neck: Neck supple. No tracheal deviation present.  No stiffness or rigidity. Normal rom. No mass felt. No supraclavicular/neck l/a or mass.   Cardiovascular: Normal rate, regular rhythm, normal heart sounds and intact distal pulses.  Pulmonary/Chest: Effort normal. No respiratory distress.  Abdominal: Normal appearance. She exhibits no distension.  Musculoskeletal:  Normal  rom c spine without pain. Spine non tender, aligned.   Lymphadenopathy:    She has no cervical adenopathy.  Neurological: She is alert.  Skin: Skin is  warm and dry. No rash noted.  No cellulitis.   Psychiatric: She has a normal mood and affect.  Nursing note and vitals reviewed.    ED Treatments / Results  Labs (all labs ordered are listed, but only abnormal results are displayed) Labs Reviewed - No data to display  EKG None  Radiology No results found.  Procedures Procedures (including critical care time)  Medications Ordered in ED Medications  ibuprofen (ADVIL,MOTRIN) tablet 600 mg (has no administration in time range)     Initial Impression / Assessment and Plan / ED Course  I have reviewed the triage vital signs and the nursing notes.  Pertinent labs & imaging results that were available during my care of the patient were reviewed by me and considered in my medical decision making (see chart for details).  Pt examined.   Reviewed nursing notes and prior charts for additional history.   ?viral vs musculoskeletal process.  No current bacterial infection noted. Afebrile. No mass felt.   No meds pta. Motrin po.  Pt currently appears stable for d/c.   rec pcp f/u.     Final Clinical Impressions(s) / ED Diagnoses   Final diagnoses:  None    ED Discharge Orders    None       Cathren Laine, MD 07/06/18 [phone]

## 2018-07-06 NOTE — Discharge Instructions (Addendum)
It was our pleasure to provide your ER care today - we hope that you feel better.  Take motrin as need for pain. Apply head to sore area.  Follow up with primary care doctor in 1 week if symptoms fail to improve/resolve.  Return to ER if worse, new symptoms, high fevers, increased swelling/redness to area, other concern.

## 2018-07-06 NOTE — ED Notes (Signed)
Pt/family verbalized understanding of discharge instructions.   

## 2018-08-08 ENCOUNTER — Emergency Department (HOSPITAL_BASED_OUTPATIENT_CLINIC_OR_DEPARTMENT_OTHER): Payer: Self-pay

## 2018-08-08 ENCOUNTER — Other Ambulatory Visit: Payer: Self-pay

## 2018-08-08 ENCOUNTER — Emergency Department (HOSPITAL_BASED_OUTPATIENT_CLINIC_OR_DEPARTMENT_OTHER)
Admission: EM | Admit: 2018-08-08 | Discharge: 2018-08-08 | Disposition: A | Discharge status: Home / Self Care | Payer: Self-pay | Attending: Emergency Medicine | Admitting: Emergency Medicine

## 2018-08-08 ENCOUNTER — Encounter (HOSPITAL_BASED_OUTPATIENT_CLINIC_OR_DEPARTMENT_OTHER): Payer: Self-pay | Admitting: Emergency Medicine

## 2018-08-08 DIAGNOSIS — Z79899 Other long term (current) drug therapy: Secondary | ICD-10-CM | POA: Insufficient documentation

## 2018-08-08 DIAGNOSIS — M25561 Pain in right knee: Secondary | ICD-10-CM | POA: Insufficient documentation

## 2018-08-08 DIAGNOSIS — R600 Localized edema: Secondary | ICD-10-CM | POA: Insufficient documentation

## 2018-08-08 LAB — URINALYSIS, ROUTINE W REFLEX MICROSCOPIC
BILIRUBIN URINE: NEGATIVE
Glucose, UA: NEGATIVE mg/dL
Hgb urine dipstick: NEGATIVE
KETONES UR: NEGATIVE mg/dL
Leukocytes, UA: NEGATIVE
NITRITE: NEGATIVE
Protein, ur: NEGATIVE mg/dL
Specific Gravity, Urine: <1.005 — ABNORMAL LOW (ref 1.005–1.030)
pH: 7 (ref 5.0–8.0)

## 2018-08-08 LAB — CBC WITH DIFFERENTIAL/PLATELET
Abs Immature Granulocytes: 0.02 10*3/uL (ref 0.00–0.07)
BASOS ABS: 0 10*3/uL (ref 0.0–0.1)
Basophils Relative: 0 %
EOS ABS: 0.1 10*3/uL (ref 0.0–0.5)
EOS PCT: 2 %
HCT: 38.9 % (ref 36.0–46.0)
HEMOGLOBIN: 12 g/dL (ref 12.0–15.0)
Immature Granulocytes: 0 %
LYMPHS PCT: 26 %
Lymphs Abs: 1.3 10*3/uL (ref 0.7–4.0)
MCH: 26 pg (ref 26.0–34.0)
MCHC: 30.8 g/dL (ref 30.0–36.0)
MCV: 84.4 fL (ref 80.0–100.0)
Monocytes Absolute: 0.5 10*3/uL (ref 0.1–1.0)
Monocytes Relative: 10 %
Neutro Abs: 3 10*3/uL (ref 1.7–7.7)
Neutrophils Relative %: 62 %
Platelets: 316 10*3/uL (ref 150–400)
RBC: 4.61 MIL/uL (ref 3.87–5.11)
RDW: 13.7 % (ref 11.5–15.5)
WBC: 4.9 10*3/uL (ref 4.0–10.5)
nRBC: 0 % (ref 0.0–0.2)

## 2018-08-08 LAB — BASIC METABOLIC PANEL
Anion gap: 7 (ref 5–15)
BUN: 15 mg/dL (ref 6–20)
CO2: 27 mmol/L (ref 22–32)
CREATININE: 0.78 mg/dL (ref 0.44–1.00)
Calcium: 9.2 mg/dL (ref 8.9–10.3)
Chloride: 106 mmol/L (ref 98–111)
GFR calc Af Amer: >60 mL/min (ref >60)
Glucose, Bld: 101 mg/dL — ABNORMAL HIGH (ref 70–99)
Potassium: 3.3 mmol/L — ABNORMAL LOW (ref 3.5–5.1)
SODIUM: 140 mmol/L (ref 135–145)

## 2018-08-08 LAB — PREGNANCY, URINE: PREG TEST UR: NEGATIVE

## 2018-08-08 LAB — D-DIMER, QUANTITATIVE: D-Dimer, Quant: 0.41 ug/mL-FEU (ref 0.00–0.50)

## 2018-08-08 MED ORDER — CELECOXIB 200 MG PO CAPS: 200.0000 mg | ORAL_CAPSULE | Freq: Two times a day (BID) | ORAL | 0 refills | Status: AC

## 2018-08-08 NOTE — ED Provider Notes (Signed)
[facility] EMERGENCY DEPARTMENT Provider Note   CSN: [id number] Arrival date & time: 08/08/18  8295     History   Chief Complaint Chief Complaint  Patient presents with  . Knee Pain    HPI Sara Porter is a 42 y.o. female.  Pt presents to the ED today with right knee pain and bilateral leg swelling.  The pt denies any injury.  She said it hurts to "mash" on it.  She denies any sob or cp.     Past Medical History:  Diagnosis Date  . Anemia   . BV (bacterial vaginosis)   . Herpes   . Menorrhagia     Patient Active Problem List   Diagnosis Date Noted  . Iron deficiency anemia 06/30/2012  . Menorrhagia 06/30/2012    Past Surgical History:  Procedure Laterality Date  . TUBAL LIGATION       OB History    Gravida  4   Para  4   Term  4   Preterm      AB      Living  4     SAB      TAB      Ectopic      Multiple      Live Births               Home Medications    Prior to Admission medications   Medication Sig Start Date End Date Taking? Authorizing Provider  ibuprofen (ADVIL,MOTRIN) 600 MG tablet Take 1 tablet (600 mg total) by mouth every 8 (eight) hours as needed. Take with food. 07/06/18  Yes Cathren Laine, MD  celecoxib (CELEBREX) 200 MG capsule Take 1 capsule (200 mg total) by mouth 2 (two) times daily. 08/08/18   Jacalyn Lefevre, MD  Ibuprofen-diphenhydrAMINE Cit 200-38 MG TABS Take by mouth.    [provider]  Omeprazole 20 MG TBEC Take 20 mg by mouth every morning.    [provider]    Family History Family History  Problem Relation Age of Onset  . Hypertension Mother   . Cancer Father   . Anemia Daughter   . Anemia Daughter     Social History Social History   Tobacco Use  . Smoking status: Never Smoker  . Smokeless tobacco: Never Used  Substance Use Topics  . Alcohol use: No  . Drug use: No     Allergies   Patient has no known allergies.   Review of Systems Review of  Systems  Musculoskeletal:       Knee pain  All other systems reviewed and are negative.    Physical Exam Updated Vital Signs BP 113/90 (BP Location: Left Arm)   Pulse 94   Temp 98.3 F (36.8 C) (Oral)   Resp 18   Ht 5\' 1"  (1.549 m)   Wt 126.6 kg   LMP 07/22/2018   SpO2 100%   BMI 52.72 kg/m   Physical Exam  Constitutional: She is oriented to person, place, and time. She appears well-developed and well-nourished.  HENT:  Head: Normocephalic and atraumatic.  Right Ear: External ear normal.  Left Ear: External ear normal.  Nose: Nose normal.  Mouth/Throat: Oropharynx is clear and moist.  Eyes: Pupils are equal, round, and reactive to light. Conjunctivae and EOM are normal.  Neck: Normal range of motion. Neck supple.  Cardiovascular: Normal rate, regular rhythm, normal heart sounds and intact distal pulses.  Pulmonary/Chest: Effort normal and breath sounds normal.  Abdominal: Soft.  Bowel sounds are normal.  Musculoskeletal:       Right knee: Tenderness found. Lateral joint line tenderness noted.  Neurological: She is alert and oriented to person, place, and time.  Skin: Skin is warm. Capillary refill takes less than 2 seconds.  Psychiatric: She has a normal mood and affect. Her behavior is normal. Judgment and thought content normal.  Nursing note and vitals reviewed.    ED Treatments / Results  Labs (all labs ordered are listed, but only abnormal results are displayed) Labs Reviewed  URINALYSIS, ROUTINE W REFLEX MICROSCOPIC - Abnormal; Notable for the following components:      Result Value   APPearance HAZY (*)    Specific Gravity, Urine <1.005 (*)    All other components within normal limits  BASIC METABOLIC PANEL - Abnormal; Notable for the following components:   Potassium 3.3 (*)    Glucose, Bld 101 (*)    All other components within normal limits  CBC WITH DIFFERENTIAL/PLATELET  PREGNANCY, URINE  D-DIMER, QUANTITATIVE (NOT AT [facility])     EKG None  Radiology Dg Knee Complete 4 Views Right  Result Date: 08/08/2018 CLINICAL DATA:  42 year old female with anterior knee pain, no known injury. EXAM: RIGHT KNEE - COMPLETE 4+ VIEW COMPARISON:  Tib-fib series 03/21/2009. FINDINGS: Bone mineralization is within normal limits. Medial and lateral compartment degenerative spurring with mild joint space loss. Possible small joint effusion. Patellofemoral joint space appears preserved. No acute osseous abnormality identified. 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 Electronically Signed   By: Odessa Fleming M.D.   On: 08/08/2018 09:16    Procedures Procedures (including critical care time)  Medications Ordered in ED Medications - No data to display   Initial Impression / Assessment and Plan / ED Course  I have reviewed the triage vital signs and the nursing notes.  Pertinent labs & imaging results that were available during my care of the patient were reviewed by me and considered in my medical decision making (see chart for details).     Pt has evidence of arthritis.  This is likely from her weight.  I encouraged to modify diet and to exercise.  I think PT would help, so she is instructed to f/u with Dr. Pearletha Forge.  Return if worse.  Final Clinical Impressions(s) / ED Diagnoses   Final diagnoses:  Acute pain of right knee    ED Discharge Orders         Ordered    celecoxib (CELEBREX) 200 MG capsule  2 times daily     08/08/18 0947           Jacalyn Lefevre, MD 08/08/18 [phone]

## 2018-08-08 NOTE — ED Triage Notes (Signed)
Pain to right knee x 2 days, feels stiff, denies recent injury. Has not taken OTC meds

## 2018-08-08 NOTE — ED Notes (Signed)
Patient transported to X-ray 

## 2019-01-08 IMAGING — CR DG CHEST 2V
2 series · 2 of 2 positions shown · non-contrast
Comparison: 09/13/2017

CLINICAL DATA: Right-sided chest pain for 2 days

EXAM:
CHEST  2 VIEW

[w chest pa]
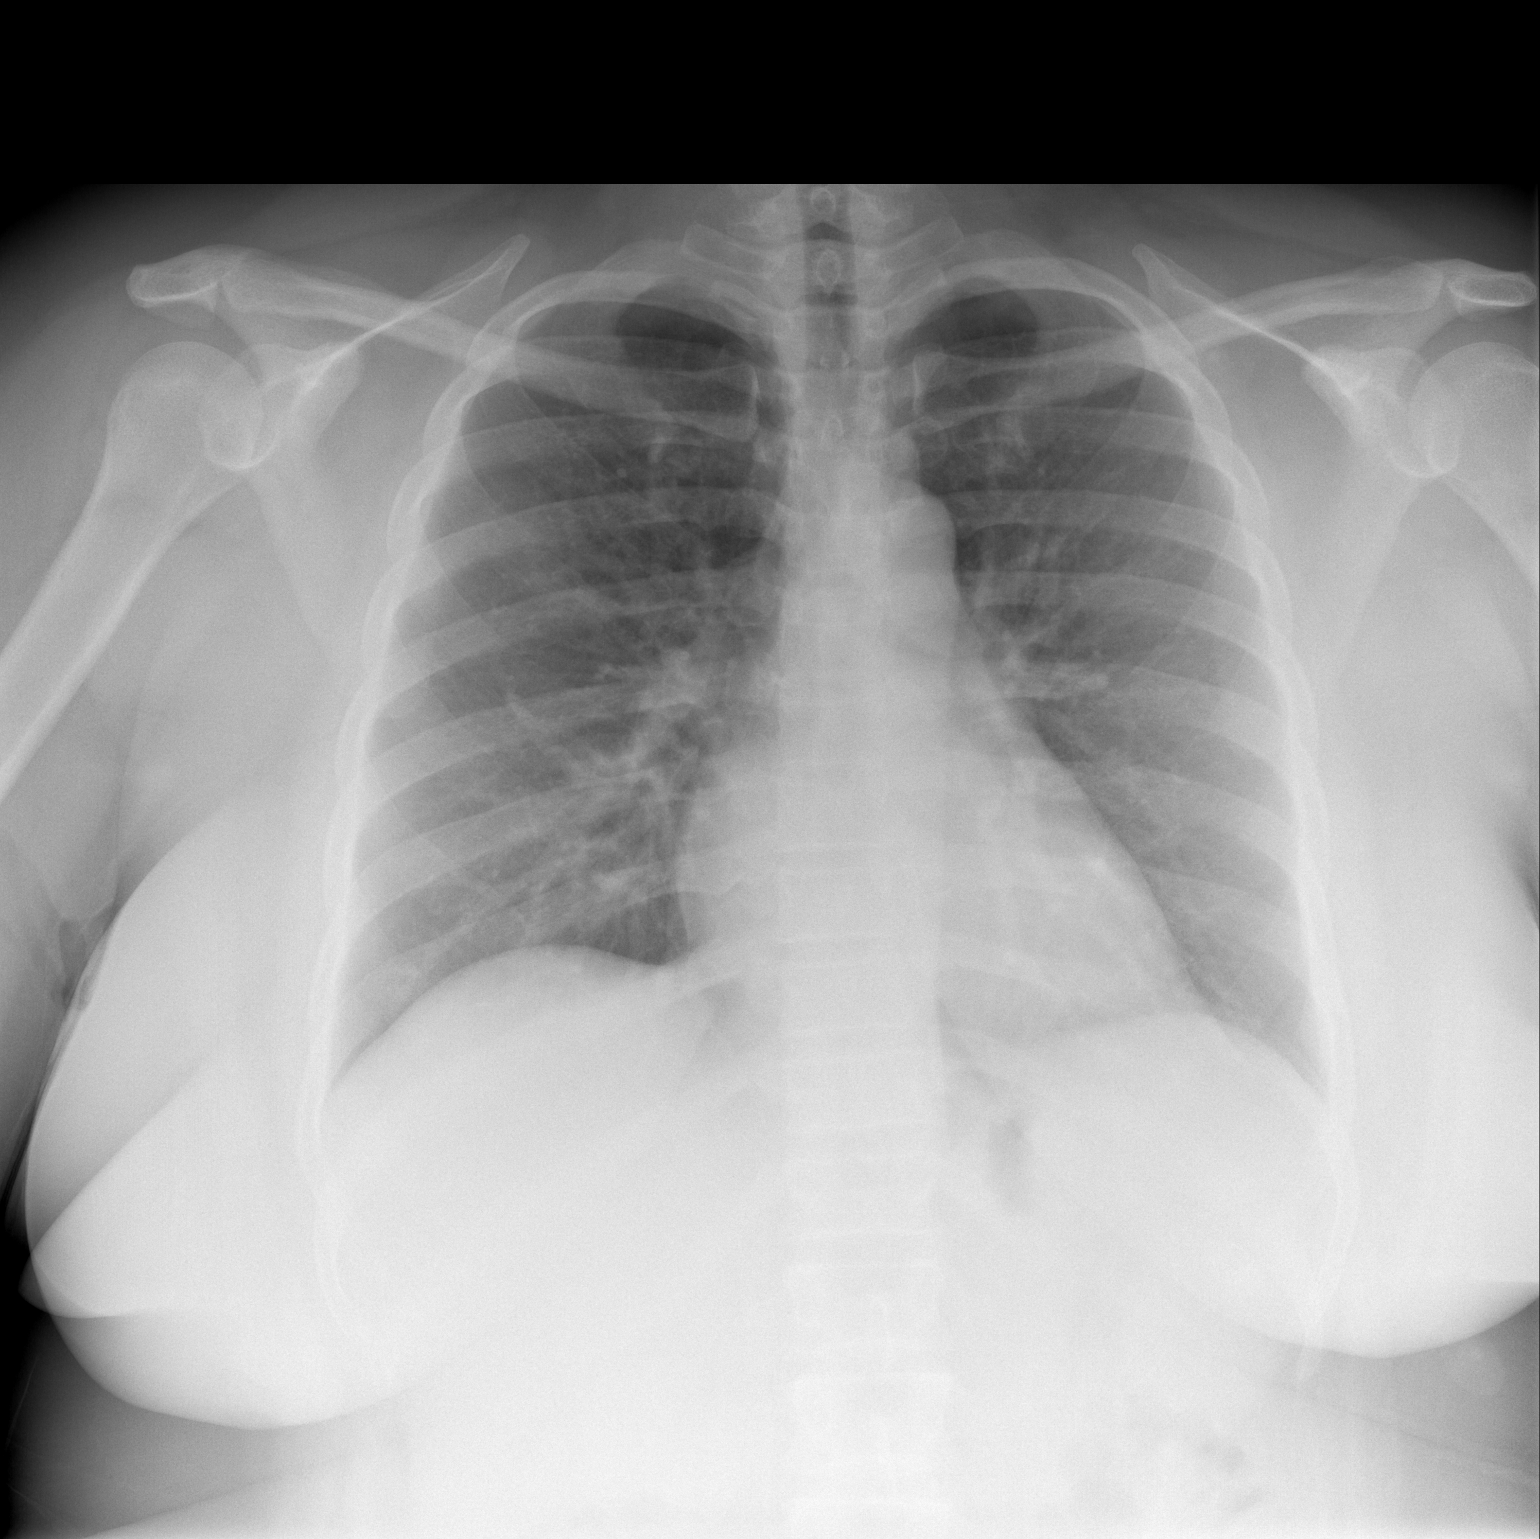

[w chest lat]
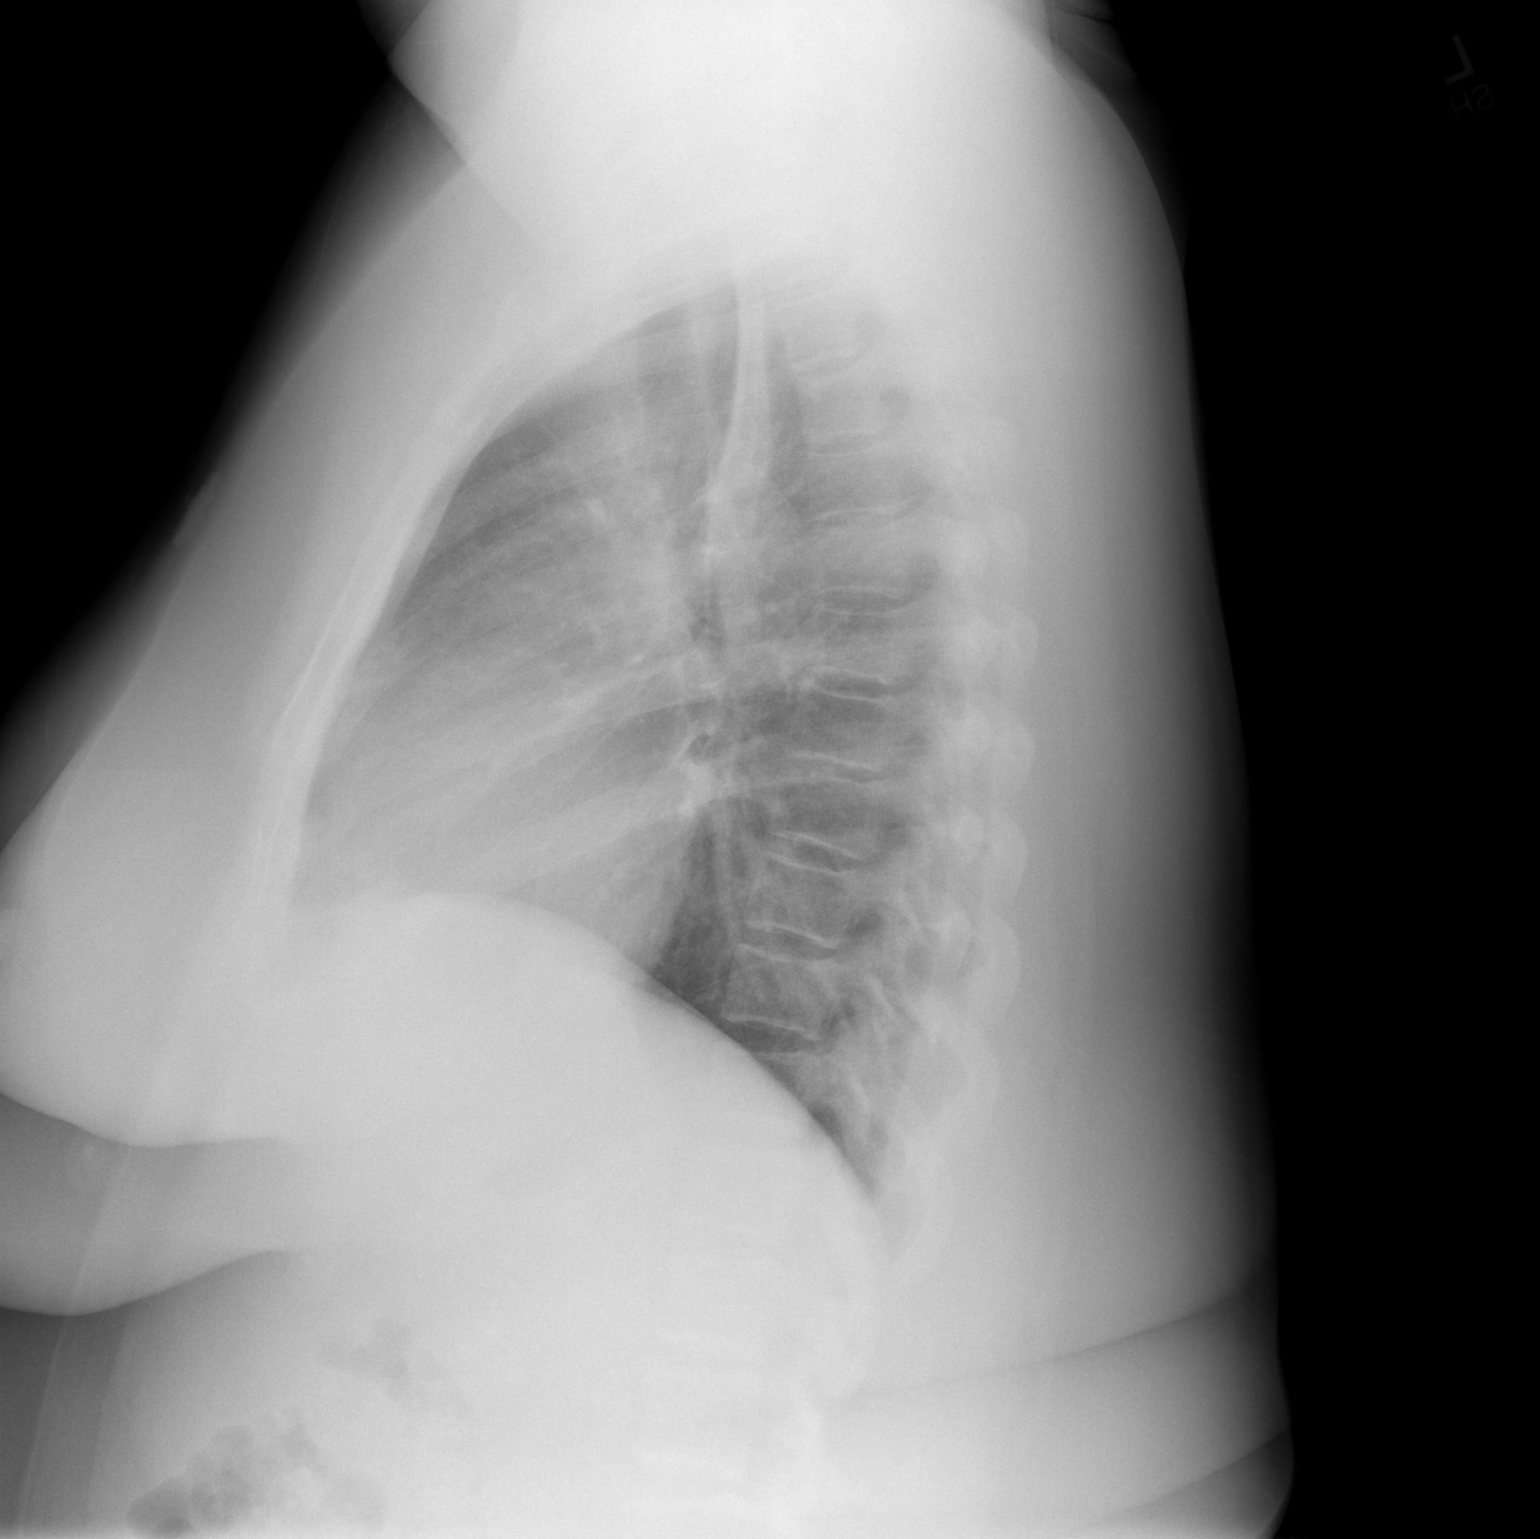

[2 of 2 positions shown; findings below may reference images not displayed]

FINDINGS: The heart size and mediastinal contours are within normal limits.
Both lungs are clear. The visualized skeletal structures are
unremarkable.
IMPRESSION: No active cardiopulmonary disease.

## 2019-04-20 ENCOUNTER — Emergency Department (HOSPITAL_BASED_OUTPATIENT_CLINIC_OR_DEPARTMENT_OTHER)
Admission: EM | Admit: 2019-04-20 | Discharge: 2019-04-20 | Disposition: A | Discharge status: Home / Self Care | Payer: BLUE CROSS/BLUE SHIELD | Attending: Emergency Medicine | Admitting: Emergency Medicine

## 2019-04-20 ENCOUNTER — Other Ambulatory Visit: Payer: Self-pay

## 2019-04-20 ENCOUNTER — Encounter (HOSPITAL_BASED_OUTPATIENT_CLINIC_OR_DEPARTMENT_OTHER): Payer: Self-pay

## 2019-04-20 DIAGNOSIS — L509 Urticaria, unspecified: Secondary | ICD-10-CM | POA: Insufficient documentation

## 2019-04-20 DIAGNOSIS — R21 Rash and other nonspecific skin eruption: Secondary | ICD-10-CM | POA: Diagnosis present

## 2019-04-20 MED ORDER — PREDNISONE 50 MG PO TABS
60.0000 mg | ORAL_TABLET | Freq: Once | ORAL | Status: AC
  Administered 2019-04-20: 60 mg via ORAL
  Filled 2019-04-20: qty 1

## 2019-04-20 MED ORDER — FAMOTIDINE 20 MG PO TABS: 20.0000 mg | ORAL_TABLET | Freq: Two times a day (BID) | ORAL | 0 refills | Status: AC

## 2019-04-20 MED ORDER — PREDNISONE 20 MG PO TABS: 60.0000 mg | ORAL_TABLET | Freq: Every day | ORAL | 0 refills | Status: AC

## 2019-04-20 MED ORDER — FAMOTIDINE 20 MG PO TABS
20.0000 mg | ORAL_TABLET | Freq: Once | ORAL | Status: AC
  Administered 2019-04-20: 20 mg via ORAL
  Filled 2019-04-20: qty 1

## 2019-04-20 NOTE — ED Provider Notes (Addendum)
[facility] EMERGENCY DEPARTMENT Provider Note   CSN: [id number] Arrival date & time: 04/20/19  1228    History   Chief Complaint Chief Complaint  Patient presents with  . Rash    HPI Sara Porter is a 43 y.o. female.     Sara Porter is a 43 y.o. female with a history of anemia and menorrhagia, who presents to the ED for evaluation of rash.  She reports she first noticed rash on 7/4 when she noticed itching to her left upper arm.  She noted on red raised area and then developed 2 similar areas on her abdomen.  The areas have increased in size, they are extremely pruritic, she denies pain.  She has not noticed any other rash or itchiness elsewhere.  No associated facial swelling, difficulty breathing or swallowing.  No nausea or vomiting.  No lightheadedness.  She reports that the short she was welling on 7/4 was new and had not been washed yet but she denies any other new exposures, no new household products, medications or foods.  No previous history of anaphylaxis.  She has been taking some Benadryl over the past 2 days which has given her some relief from the itching, but also makes her very sleepy she is also use some hydrocortisone cream but the rash persisted despite this.     Past Medical History:  Diagnosis Date  . Anemia   . BV (bacterial vaginosis)   . Herpes   . Menorrhagia     Patient Active Problem List   Diagnosis Date Noted  . Iron deficiency anemia 06/30/2012  . Menorrhagia 06/30/2012    Past Surgical History:  Procedure Laterality Date  . TUBAL LIGATION       OB History    Gravida  4   Para  4   Term  4   Preterm      AB      Living  4     SAB      TAB      Ectopic      Multiple      Live Births               Home Medications    Prior to Admission medications   Medication Sig Start Date End Date Taking? Authorizing Provider  celecoxib (CELEBREX) 200 MG capsule Take 1 capsule (200 mg total) by mouth 2  (two) times daily. 08/08/18   Isla Pence, MD  famotidine (PEPCID) 20 MG tablet Take 1 tablet (20 mg total) by mouth 2 (two) times daily. 04/20/19   Jacqlyn Larsen, PA-C  ibuprofen (ADVIL,MOTRIN) 600 MG tablet Take 1 tablet (600 mg total) by mouth every 8 (eight) hours as needed. Take with food. 07/06/18   Lajean Saver, MD  Ibuprofen-diphenhydrAMINE Cit 200-38 MG TABS Take by mouth.    [provider]  Omeprazole 20 MG TBEC Take 20 mg by mouth every morning.    [provider]  predniSONE (DELTASONE) 20 MG tablet Take 3 tablets (60 mg total) by mouth daily for 5 days. 04/20/19 04/25/19  Jacqlyn Larsen, PA-C    Family History Family History  Problem Relation Age of Onset  . Hypertension Mother   . Cancer Father   . Anemia Daughter   . Anemia Daughter     Social History Social History   Tobacco Use  . Smoking status: Never Smoker  . Smokeless tobacco: Never Used  Substance Use Topics  . Alcohol use: No  .  Drug use: No     Allergies   Patient has no known allergies.   Review of Systems Review of Systems  Constitutional: Negative for chills and fever.  HENT: Negative for facial swelling, sore throat and trouble swallowing.   Respiratory: Negative for cough and shortness of breath.   Cardiovascular: Negative for chest pain.  Gastrointestinal: Negative for nausea and vomiting.  Skin: Positive for rash.  All other systems reviewed and are negative.    Physical Exam Updated Vital Signs BP 106/73 (BP Location: Left Arm)   Pulse 89   Temp 98.7 F (37.1 C) (Oral)   Resp 20   Ht 5\' 1"  (1.549 m)   Wt 128.4 kg   LMP 03/31/2019   SpO2 100%   BMI 53.47 kg/m   Physical Exam Vitals signs and nursing note reviewed.  Constitutional:      General: She is not in acute distress.    Appearance: Normal appearance. She is well-developed. She is not ill-appearing or diaphoretic.  HENT:     Head: Normocephalic and atraumatic.     Mouth/Throat:     Mouth: Mucous  membranes are moist.     Pharynx: Oropharynx is clear.     Comments: No facial swelling or swelling of the oropharynx Eyes:     General:        Right eye: No discharge.        Left eye: No discharge.  Pulmonary:     Effort: Pulmonary effort is normal. No respiratory distress.     Breath sounds: Normal breath sounds.  Skin:    General: Skin is warm and dry.     Findings: Rash present.     Comments: Large urticaria to the left upper arm and right lower abdomen, no other rash noted, no pustules, vesicles or petechiae.  No signs of superimposed bacterial infection  Neurological:     Mental Status: She is alert.     Coordination: Coordination normal.  Psychiatric:        Mood and Affect: Mood normal.        Behavior: Behavior normal.      ED Treatments / Results  Labs (all labs ordered are listed, but only abnormal results are displayed) Labs Reviewed - No data to display  EKG None  Radiology No results found.  Procedures Procedures (including critical care time)  Medications Ordered in ED Medications  predniSONE (DELTASONE) tablet 60 mg (has no administration in time range)  famotidine (PEPCID) tablet 20 mg (has no administration in time range)     Initial Impression / Assessment and Plan / ED Course  I have reviewed the triage vital signs and the nursing notes.  Pertinent labs & imaging results that were available during my care of the patient were reviewed by me and considered in my medical decision making (see chart for details).  Rash consistent with urticaria. Patient denies any difficulty breathing or swallowing.  Pt has a patent airway without stridor and is handling secretions without difficulty; no angioedema. No blisters, no pustules, no warmth, no draining sinus tracts, no superficial abscesses, no bullous impetigo, no vesicles, no desquamation, no target lesions with dusky purpura or a central bulla. Not tender to touch. No concern for superimposed infection.  No concern for SJS, TEN, TSS, tick borne illness, syphilis or other life-threatening condition. Will discharge home with short course of steroids, pepcid and recommend Benadryl as needed for pruritis.    Final Clinical Impressions(s) / ED Diagnoses   Final  diagnoses:  Hives    ED Discharge Orders         Ordered    famotidine (PEPCID) 20 MG tablet  2 times daily     04/20/19 1313    predniSONE (DELTASONE) 20 MG tablet  Daily     04/20/19 1313           Dartha LodgeFord, Burdette Gergely N, [place]-C 04/20/19 1316    Dartha LodgeFord, Damon Baisch N, [place] 04/20/19 1317    Little, Ambrose Finlandachel Morgan, MD 04/20/19 1553

## 2019-04-20 NOTE — ED Triage Notes (Signed)
Pt c/o rash to left UE, right abd area and right inner thigh area-first noticed 7/4NAD-steady gait-pt tested +covid on 6/23-pt states she was "cleared last week"-NAD-steady gait

## 2019-04-20 NOTE — Discharge Instructions (Signed)
Your rash is caused by hives, this is a type of allergic reaction.  You can continue to take Benadryl, if this makes you drowsy you can take Zyrtec once daily in the morning and then Benadryl at night.  You can also take Pepcid twice daily and I would like for you to take steroids once daily starting tomorrow, take these in the morning with food.  If your rash is still not improving please follow-up with your primary care doctor.  If you develop facial swelling, difficulty breathing or swallowing or any other new or concerning symptoms you can return to the emergency department.

## 2019-09-25 IMAGING — DX DG KNEE COMPLETE 4+V*R*
4 series · 4 of 4 positions shown · non-contrast
Comparison: Tib-fib series 03/21/2009.

CLINICAL DATA: 42-year-old female with anterior knee pain, no known
injury.

EXAM:
RIGHT KNEE - COMPLETE 4+ VIEW

[knee ap]
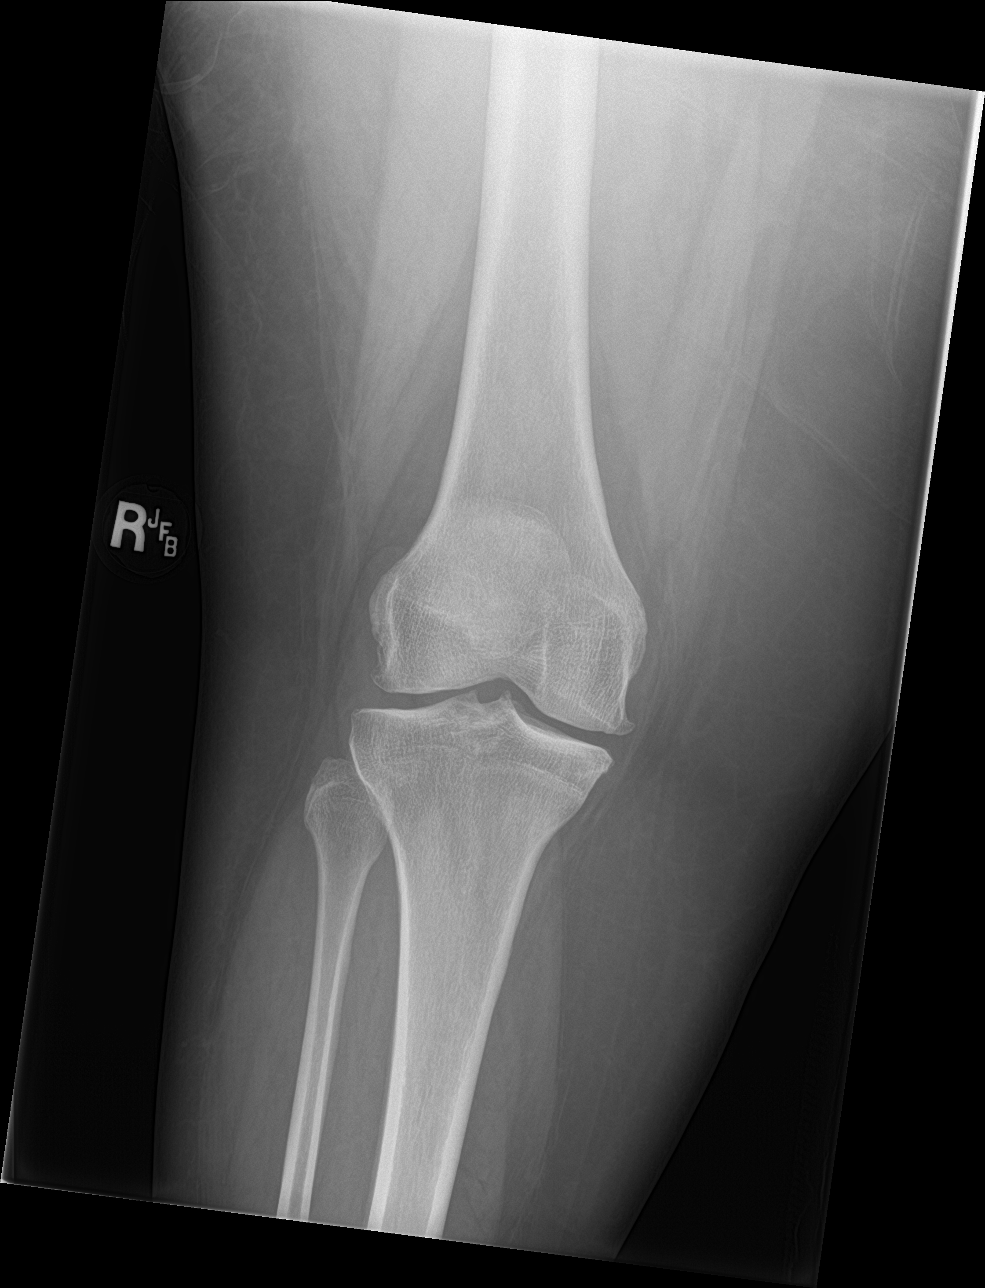

[knee lat]
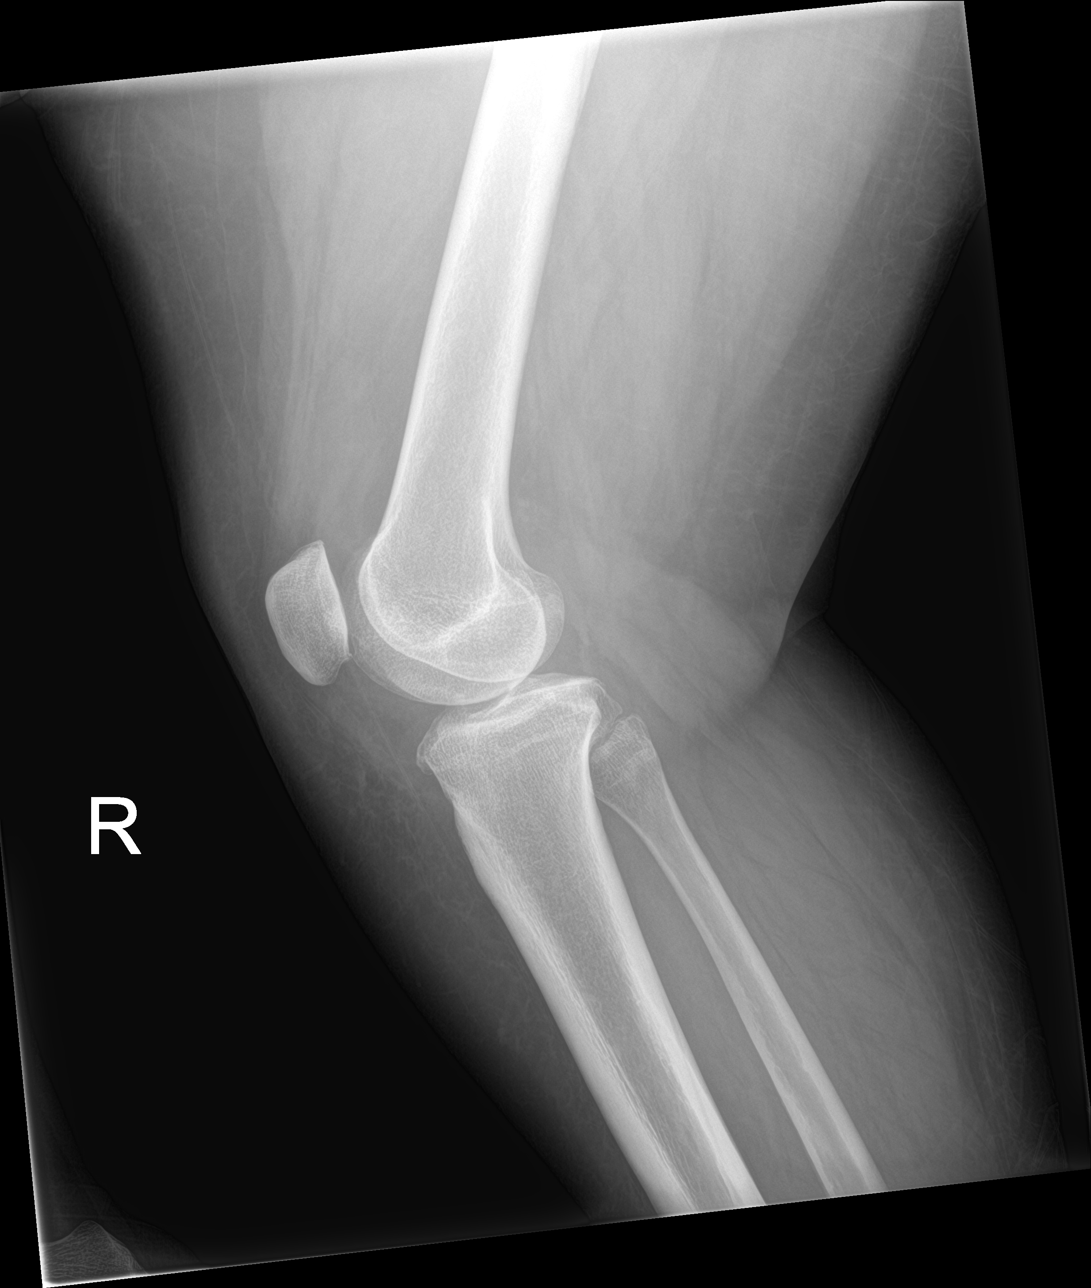

[knee obl (1 of 2)]
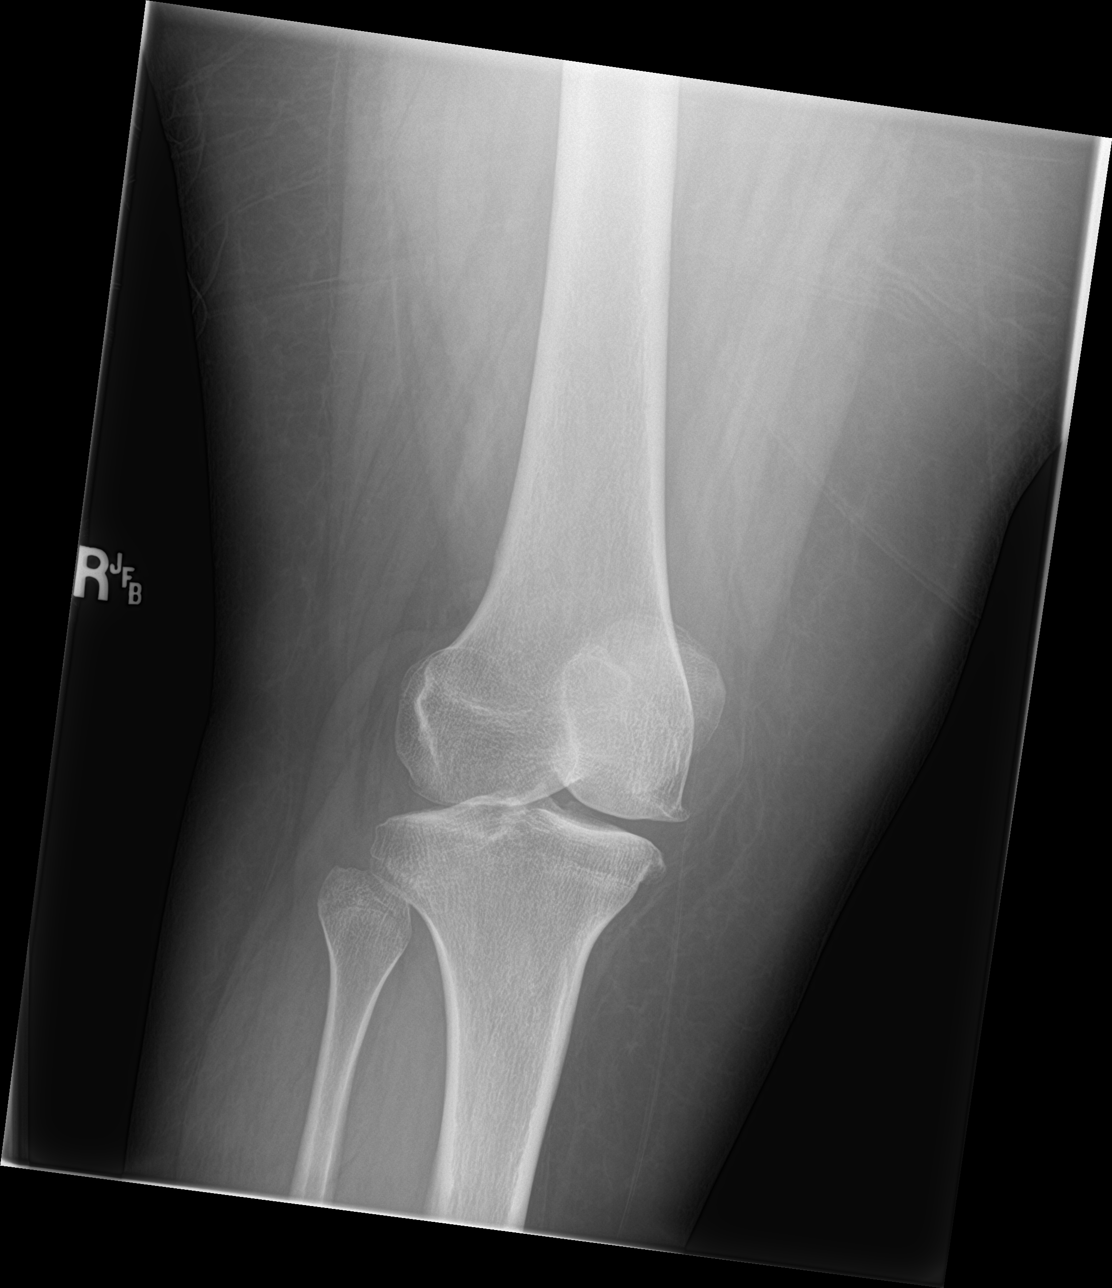

[knee obl (2 of 2)]
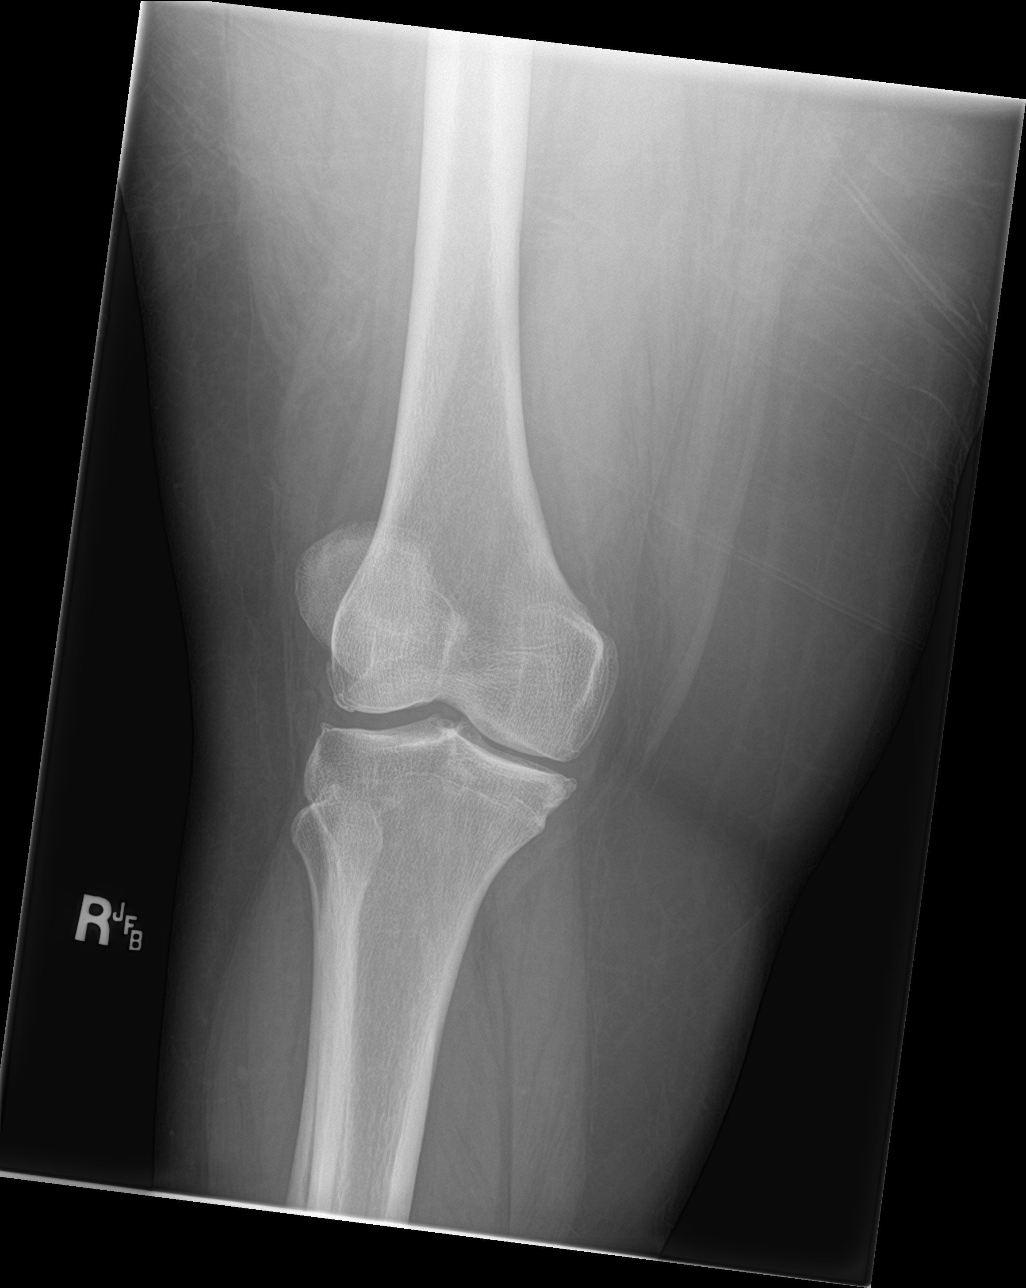

[4 of 4 positions shown; findings below may reference images not displayed]

FINDINGS: Bone mineralization is within normal limits. Medial and lateral
compartment degenerative spurring with mild joint space loss.
Possible small joint effusion. Patellofemoral joint space appears
preserved. No acute osseous abnormality identified.
IMPRESSION: Medial and lateral compartment right knee joint degeneration with
possible joint effusion but no acute osseous abnormality identified.

## 2020-04-14 ENCOUNTER — Encounter (HOSPITAL_BASED_OUTPATIENT_CLINIC_OR_DEPARTMENT_OTHER): Payer: Self-pay

## 2020-04-14 ENCOUNTER — Emergency Department (HOSPITAL_BASED_OUTPATIENT_CLINIC_OR_DEPARTMENT_OTHER): Payer: BLUE CROSS/BLUE SHIELD

## 2020-04-14 ENCOUNTER — Emergency Department (HOSPITAL_BASED_OUTPATIENT_CLINIC_OR_DEPARTMENT_OTHER)
Admission: EM | Admit: 2020-04-14 | Discharge: 2020-04-14 | Disposition: A | Discharge status: Home / Self Care | Payer: BLUE CROSS/BLUE SHIELD | Attending: Emergency Medicine | Admitting: Emergency Medicine

## 2020-04-14 ENCOUNTER — Other Ambulatory Visit: Payer: Self-pay

## 2020-04-14 DIAGNOSIS — R0789 Other chest pain: Secondary | ICD-10-CM | POA: Insufficient documentation

## 2020-04-14 LAB — CBC
HCT: 36.7 % (ref 36.0–46.0)
Hemoglobin: 11.6 g/dL — ABNORMAL LOW (ref 12.0–15.0)
MCH: 27.8 pg (ref 26.0–34.0)
MCHC: 31.6 g/dL (ref 30.0–36.0)
MCV: 87.8 fL (ref 80.0–100.0)
Platelets: 329 10*3/uL (ref 150–400)
RBC: 4.18 MIL/uL (ref 3.87–5.11)
RDW: 13.2 % (ref 11.5–15.5)
WBC: 5.9 10*3/uL (ref 4.0–10.5)
nRBC: 0 % (ref 0.0–0.2)

## 2020-04-14 LAB — BASIC METABOLIC PANEL
Anion gap: 8 (ref 5–15)
BUN: 15 mg/dL (ref 6–20)
CO2: 26 mmol/L (ref 22–32)
Calcium: 9.1 mg/dL (ref 8.9–10.3)
Chloride: 105 mmol/L (ref 98–111)
Creatinine, Ser: 0.68 mg/dL (ref 0.44–1.00)
GFR calc Af Amer: >60 mL/min (ref >60)
GFR calc non Af Amer: >60 mL/min (ref >60)
Glucose, Bld: 116 mg/dL — ABNORMAL HIGH (ref 70–99)
Potassium: 3.6 mmol/L (ref 3.5–5.1)
Sodium: 139 mmol/L (ref 135–145)

## 2020-04-14 LAB — TROPONIN I (HIGH SENSITIVITY): Troponin I (High Sensitivity): 2 ng/L (ref <18)

## 2020-04-14 LAB — PREGNANCY, URINE: Preg Test, Ur: NEGATIVE

## 2020-04-14 MED ORDER — SODIUM CHLORIDE 0.9% FLUSH
3.0000 mL | Freq: Once | INTRAVENOUS | Status: DC
  Filled 2020-04-14: qty 3

## 2020-04-14 MED ORDER — NAPROXEN 500 MG PO TABS: 500.0000 mg | ORAL_TABLET | Freq: Two times a day (BID) | ORAL | 0 refills | Status: AC

## 2020-04-14 NOTE — ED Triage Notes (Signed)
Pt c/o right side CP x today-states pain is worse with movement-NAD-steady gait

## 2020-04-14 NOTE — ED Provider Notes (Signed)
[facility] EMERGENCY DEPARTMENT Provider Note   CSN: [id number] Arrival date & time: 04/14/20  1800     History Chief Complaint  Patient presents with  . Chest Pain    Sara Porter is a 44 y.o. female.  Patient with no significant past medical history presents the emergency department today with complaint of right sided pain in the chest along the breastbone. Patient states that the pain has been constant during the day today and was first noticed when she woke up around 6 AM. Pain is worse with bending and movement. She was at [employer] earlier and felt it while she was lifting boxes. No associated nausea or vomiting, shortness of breath, diaphoresis. Pain does not radiate. No abdominal pain or fevers. No neck pain or back pain. She took a BC powder without any improvement. No history of hypertension, high cholesterol, diabetes, smoking, family history of coronary artery disease. States that her mother had a heart valve replaced. No other treatments prior to arrival. Patient does report being very stressed with family recently; her aunt passed away 2 days ago.  Patient denies risk factors for pulmonary embolism including: unilateral leg swelling, history of DVT/PE/other blood clots, use of exogenous hormones, recent immobilizations, recent surgery, recent travel (>4hr segment), malignancy, hemoptysis.          Past Medical History:  Diagnosis Date  . Anemia   . BV (bacterial vaginosis)   . Herpes   . Menorrhagia     Patient Active Problem List   Diagnosis Date Noted  . Iron deficiency anemia 06/30/2012  . Menorrhagia 06/30/2012    Past Surgical History:  Procedure Laterality Date  . TUBAL LIGATION       OB History    Gravida  4   Para  4   Term  4   Preterm      AB      Living  4     SAB      TAB      Ectopic      Multiple      Live Births              Family History  Problem Relation Age of Onset  . Hypertension Mother   .  Cancer Father   . Anemia Daughter   . Anemia Daughter     Social History   Tobacco Use  . Smoking status: Never Smoker  . Smokeless tobacco: Never Used  Vaping Use  . Vaping Use: Never used  Substance Use Topics  . Alcohol use: No  . Drug use: No    Home Medications Prior to Admission medications   Medication Sig Start Date End Date Taking? Authorizing Provider  celecoxib (CELEBREX) 200 MG capsule Take 1 capsule (200 mg total) by mouth 2 (two) times daily. 08/08/18   Jacalyn Lefevre, MD  famotidine (PEPCID) 20 MG tablet Take 1 tablet (20 mg total) by mouth 2 (two) times daily. 04/20/19   Dartha Lodge, PA-C  ibuprofen (ADVIL,MOTRIN) 600 MG tablet Take 1 tablet (600 mg total) by mouth every 8 (eight) hours as needed. Take with food. 07/06/18   Cathren Laine, MD  Ibuprofen-diphenhydrAMINE Cit 200-38 MG TABS Take by mouth.    [provider]  naproxen (NAPROSYN) 500 MG tablet Take 1 tablet (500 mg total) by mouth 2 (two) times daily. 04/14/20   Renne Crigler, PA-C  Omeprazole 20 MG TBEC Take 20 mg by mouth every morning.    [provider]    Allergies    Patient has no known allergies.  Review of Systems   Review of Systems  Constitutional: Negative for diaphoresis and fever.  Eyes: Negative for redness.  Respiratory: Negative for cough and shortness of breath.   Cardiovascular: Positive for chest pain. Negative for palpitations and leg swelling.  Gastrointestinal: Negative for abdominal pain, nausea and vomiting.  Genitourinary: Negative for dysuria.  Musculoskeletal: Negative for back pain and neck pain.  Skin: Negative for rash.  Neurological: Negative for syncope and light-headedness.  Psychiatric/Behavioral: The patient is not nervous/anxious.     Physical Exam Updated Vital Signs BP (!) 150/65   Pulse 98   Temp 98.2 F (36.8 C) (Oral)   Resp 15   Ht 5\' 2"  (1.575 m)   Wt 128.4 kg   LMP 04/07/2020   SpO2 100%   BMI 51.76 kg/m   Physical  Exam Vitals and nursing note reviewed.  Constitutional:      Appearance: She is well-developed. She is not diaphoretic.  HENT:     Head: Normocephalic and atraumatic.     Mouth/Throat:     Mouth: Mucous membranes are not dry.  Eyes:     Conjunctiva/sclera: Conjunctivae normal.  Neck:     Vascular: Normal carotid pulses. No carotid bruit or JVD.     Trachea: Trachea normal. No tracheal deviation.  Cardiovascular:     Rate and Rhythm: Normal rate and regular rhythm.     Pulses: No decreased pulses.     Heart sounds: Normal heart sounds, S1 normal and S2 normal. No murmur heard.      Comments: HR 95-100 Pulmonary:     Effort: Pulmonary effort is normal. No respiratory distress.     Breath sounds: No wheezing.  Chest:     Chest wall: Tenderness present.     Comments: Patient with tenderness to palpation over the right sternal border. She winces with palpation in this area and states that this reproduces her pain. Abdominal:     General: Bowel sounds are normal.     Palpations: Abdomen is soft.     Tenderness: There is no abdominal tenderness. There is no guarding or rebound.  Musculoskeletal:        General: Normal range of motion.     Cervical back: Normal range of motion and neck supple. No muscular tenderness.  Skin:    General: Skin is warm and dry.     Coloration: Skin is not pale.  Neurological:     Mental Status: She is alert.     ED Results / Procedures / Treatments   Labs (all labs ordered are listed, but only abnormal results are displayed) Labs Reviewed  BASIC METABOLIC PANEL - Abnormal; Notable for the following components:      Result Value   Glucose, Bld 116 (*)    All other components within normal limits  CBC - Abnormal; Notable for the following components:   Hemoglobin 11.6 (*)    All other components within normal limits  PREGNANCY, URINE  TROPONIN I (HIGH SENSITIVITY)  TROPONIN I (HIGH SENSITIVITY)    EKG EKG  Interpretation  Date/Time:  Friday April 14 2020 18:08:39 EDT Ventricular Rate:  105 PR Interval:  152 QRS Duration: 78 QT Interval:  344 QTC Calculation: 454 R Axis:   64 Text Interpretation: Sinus tachycardia Otherwise normal ECG Since last tracing rate faster Confirmed by 04-23-2005 [phone]) on 04/14/2020 6:11:13 PM   Radiology DG Chest 2 View  Result Date: 04/14/2020 CLINICAL DATA:  Right-sided chest pain EXAM: CHEST - 2 VIEW COMPARISON:  12/07/2019 FINDINGS: The heart size and mediastinal contours are within normal limits. Both lungs are clear. The visualized skeletal structures are unremarkable. IMPRESSION: No active cardiopulmonary disease. Electronically Signed   By: Jasmine Pang M.D.   On: 04/14/2020 19:07    Procedures Procedures (including critical care time)  Medications Ordered in ED Medications  sodium chloride flush (NS) 0.9 % injection 3 mL (3 mLs Intravenous Not Given 04/14/20 1947)    ED Course  I have reviewed the triage vital signs and the nursing notes.  Pertinent labs & imaging results that were available during my care of the patient were reviewed by me and considered in my medical decision making (see chart for details).  Patient seen and examined. EKG reviewed. Shows sinus tachycardia. Patient states that she was having an argument on the phone around the time this was taken.  We reviewed results at bedside. Patient has reproducible tenderness to palpation consistent with chest wall pain. She will be started on naproxen twice a day. Encouraged to avoid other NSAIDs, take this for 1 week.  Vital signs reviewed and are as follows: BP (!) 152/89   Pulse 78   Temp 98.2 F (36.8 C) (Oral)   Resp 15   Ht 5\' 2"  (1.575 m)   Wt 128.4 kg   LMP 04/07/2020   SpO2 100%   BMI 51.76 kg/m   Patient was counseled to return with severe chest pain, especially if the pain is crushing or pressure-like and spreads to the arms, back, neck, or jaw, or if they have  sweating, nausea, or shortness of breath with the pain. They were encouraged to call 911 with these symptoms.   The patient verbalized understanding and agreed.      MDM Rules/Calculators/A&P                          Patient with reproducible chest wall tenderness starting this morning. She has reproducible pain along the sternal border on the right side. EKG shows sinus tachycardia. Heart rate is lower when the patient is calm. I have low concern for blood clot. No clinical signs or symptoms of DVT. I have low concern for ACS. Troponin negative x1. EKG is nonischemic. Chest x-ray without acute findings.    Final Clinical Impression(s) / ED Diagnoses Final diagnoses:  Chest wall pain    Rx / DC Orders ED Discharge Orders         Ordered    naproxen (NAPROSYN) 500 MG tablet  2 times daily     Discontinue  Reprint     04/14/20 2001           06/15/20, Renne Crigler 04/14/20 2010    06/15/20, MD 04/14/20 2035

## 2020-04-14 NOTE — Discharge Instructions (Signed)
Please read and follow all provided instructions.  Your diagnoses today include:  1. Chest wall pain     Tests performed today include:  An EKG of your heart  A chest x-ray  Cardiac enzymes - a blood test for heart muscle damage  Blood counts and electrolytes  Vital signs. See below for your results today.   Medications prescribed:   Naproxen - anti-inflammatory pain medication  Do not exceed 500mg  naproxen every 12 hours, take with food  You have been prescribed an anti-inflammatory medication or NSAID. Take with food. Take smallest effective dose for the shortest duration needed for your pain. Stop taking if you experience stomach pain or vomiting.   Take any prescribed medications only as directed.  Follow-up instructions: Please follow-up with your primary care provider as soon as you can for further evaluation of your symptoms.   Return instructions:  SEEK IMMEDIATE MEDICAL ATTENTION IF:  You have severe chest pain, especially if the pain is crushing or pressure-like and spreads to the arms, back, neck, or jaw, or if you have sweating, nausea (feeling sick to your stomach), or shortness of breath. THIS IS AN EMERGENCY. Don't wait to see if the pain will go away. Get medical help at once. Call 911 or [phone] (operator). DO NOT drive yourself to the hospital.   Your chest pain gets worse and does not go away with rest.   You have an attack of chest pain lasting longer than usual, despite rest and treatment with the medications your caregiver has prescribed.   You wake from sleep with chest pain or shortness of breath.  You feel dizzy or faint.  You have chest pain not typical of your usual pain for which you originally saw your caregiver.   You have any other emergent concerns regarding your health.  Additional Information: Chest pain comes from many different causes. Your caregiver has diagnosed you as having chest pain that is not specific for one problem, but does not  require admission.  You are at low risk for an acute heart condition or other serious illness.   Your vital signs today were: BP (!) 150/65   Pulse 98   Temp 98.2 F (36.8 C) (Oral)   Resp 15   Ht 5\' 2"  (1.575 m)   Wt 128.4 kg   LMP 04/07/2020   SpO2 100%   BMI 51.76 kg/m  If your blood pressure (BP) was elevated above 135/85 this visit, please have this repeated by your doctor within one month. --------------

## 2021-06-01 IMAGING — CR DG CHEST 2V
2 series · 2 of 2 positions shown · non-contrast
Comparison: 12/07/2019

CLINICAL DATA: Right-sided chest pain

EXAM:
CHEST - 2 VIEW

[w chest pa]
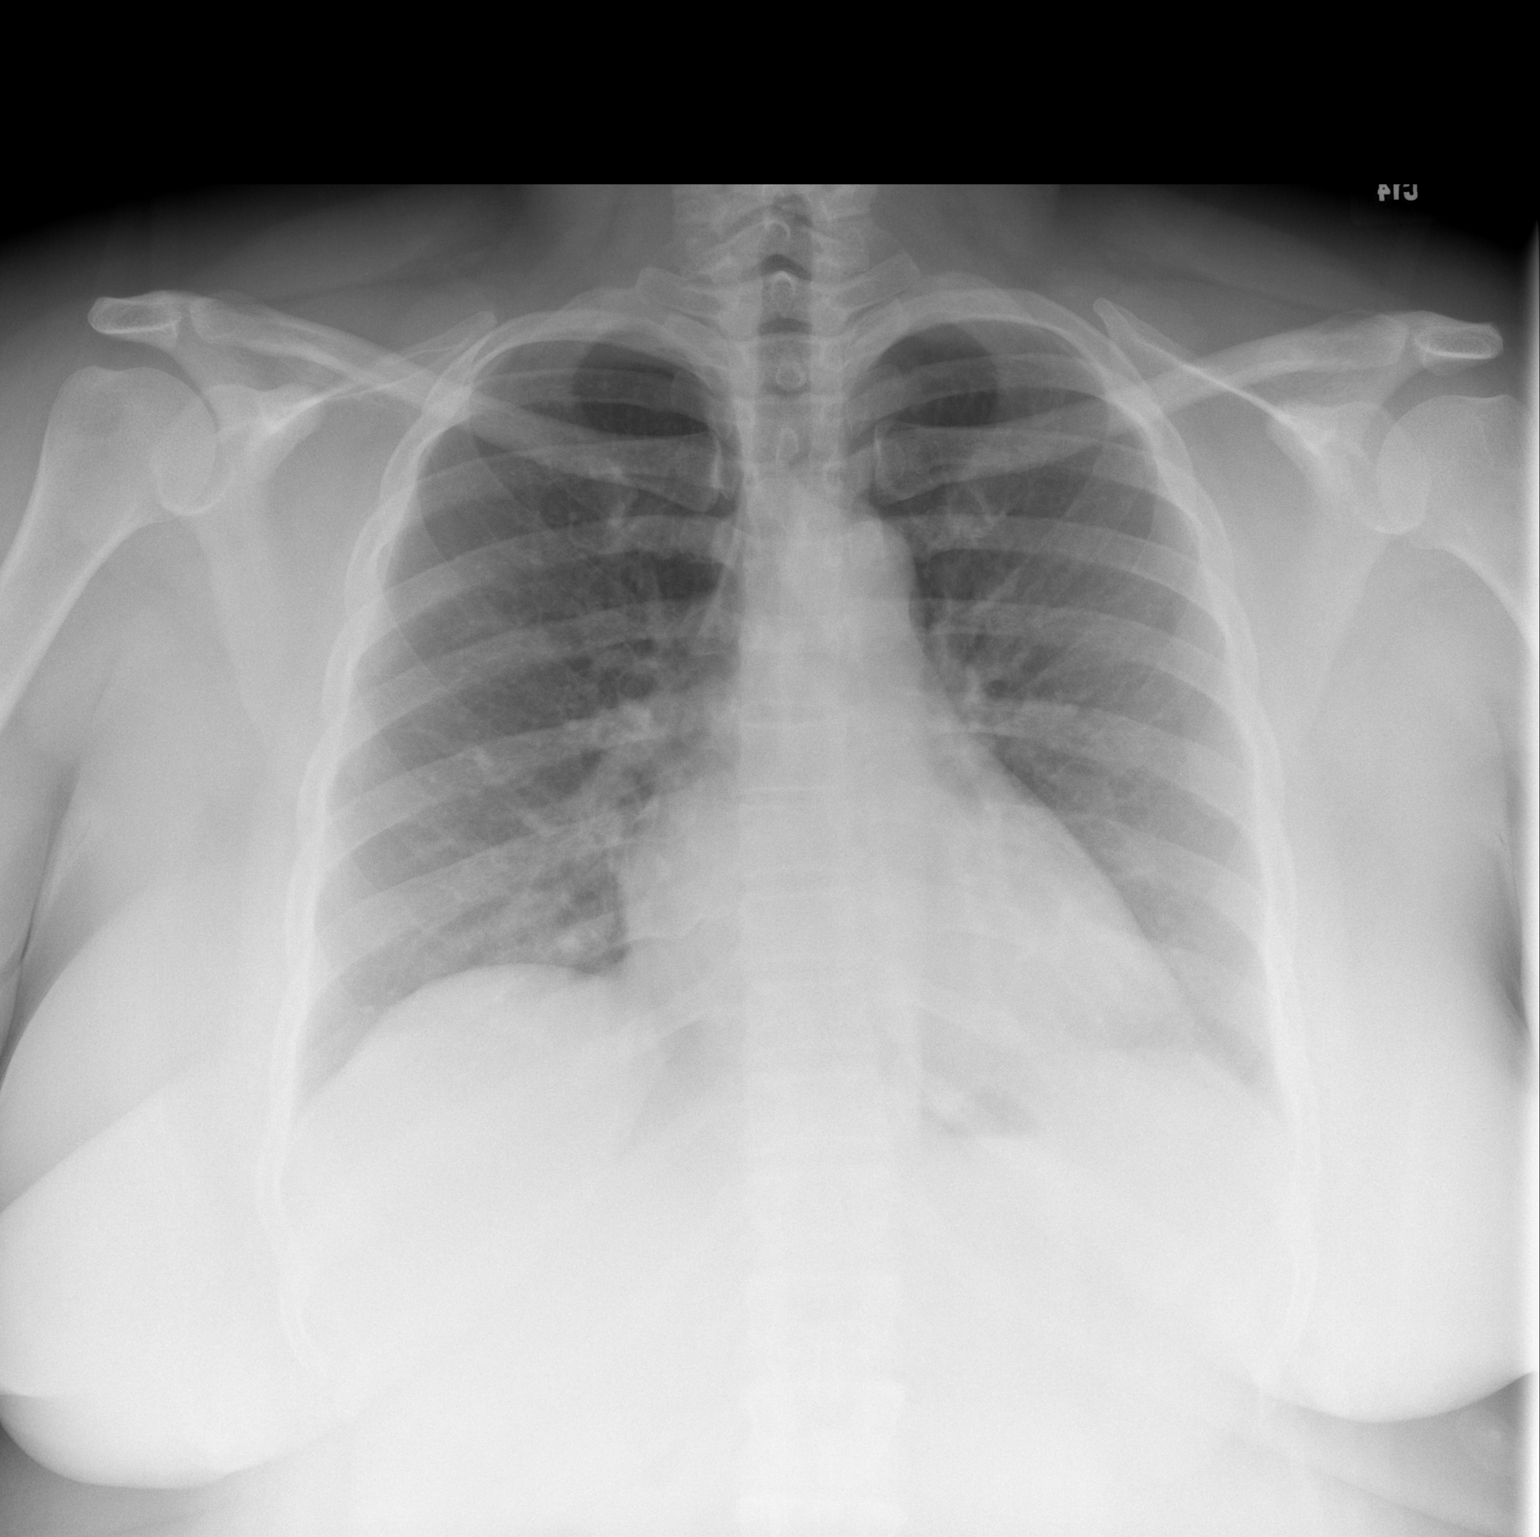

[w chest lat]
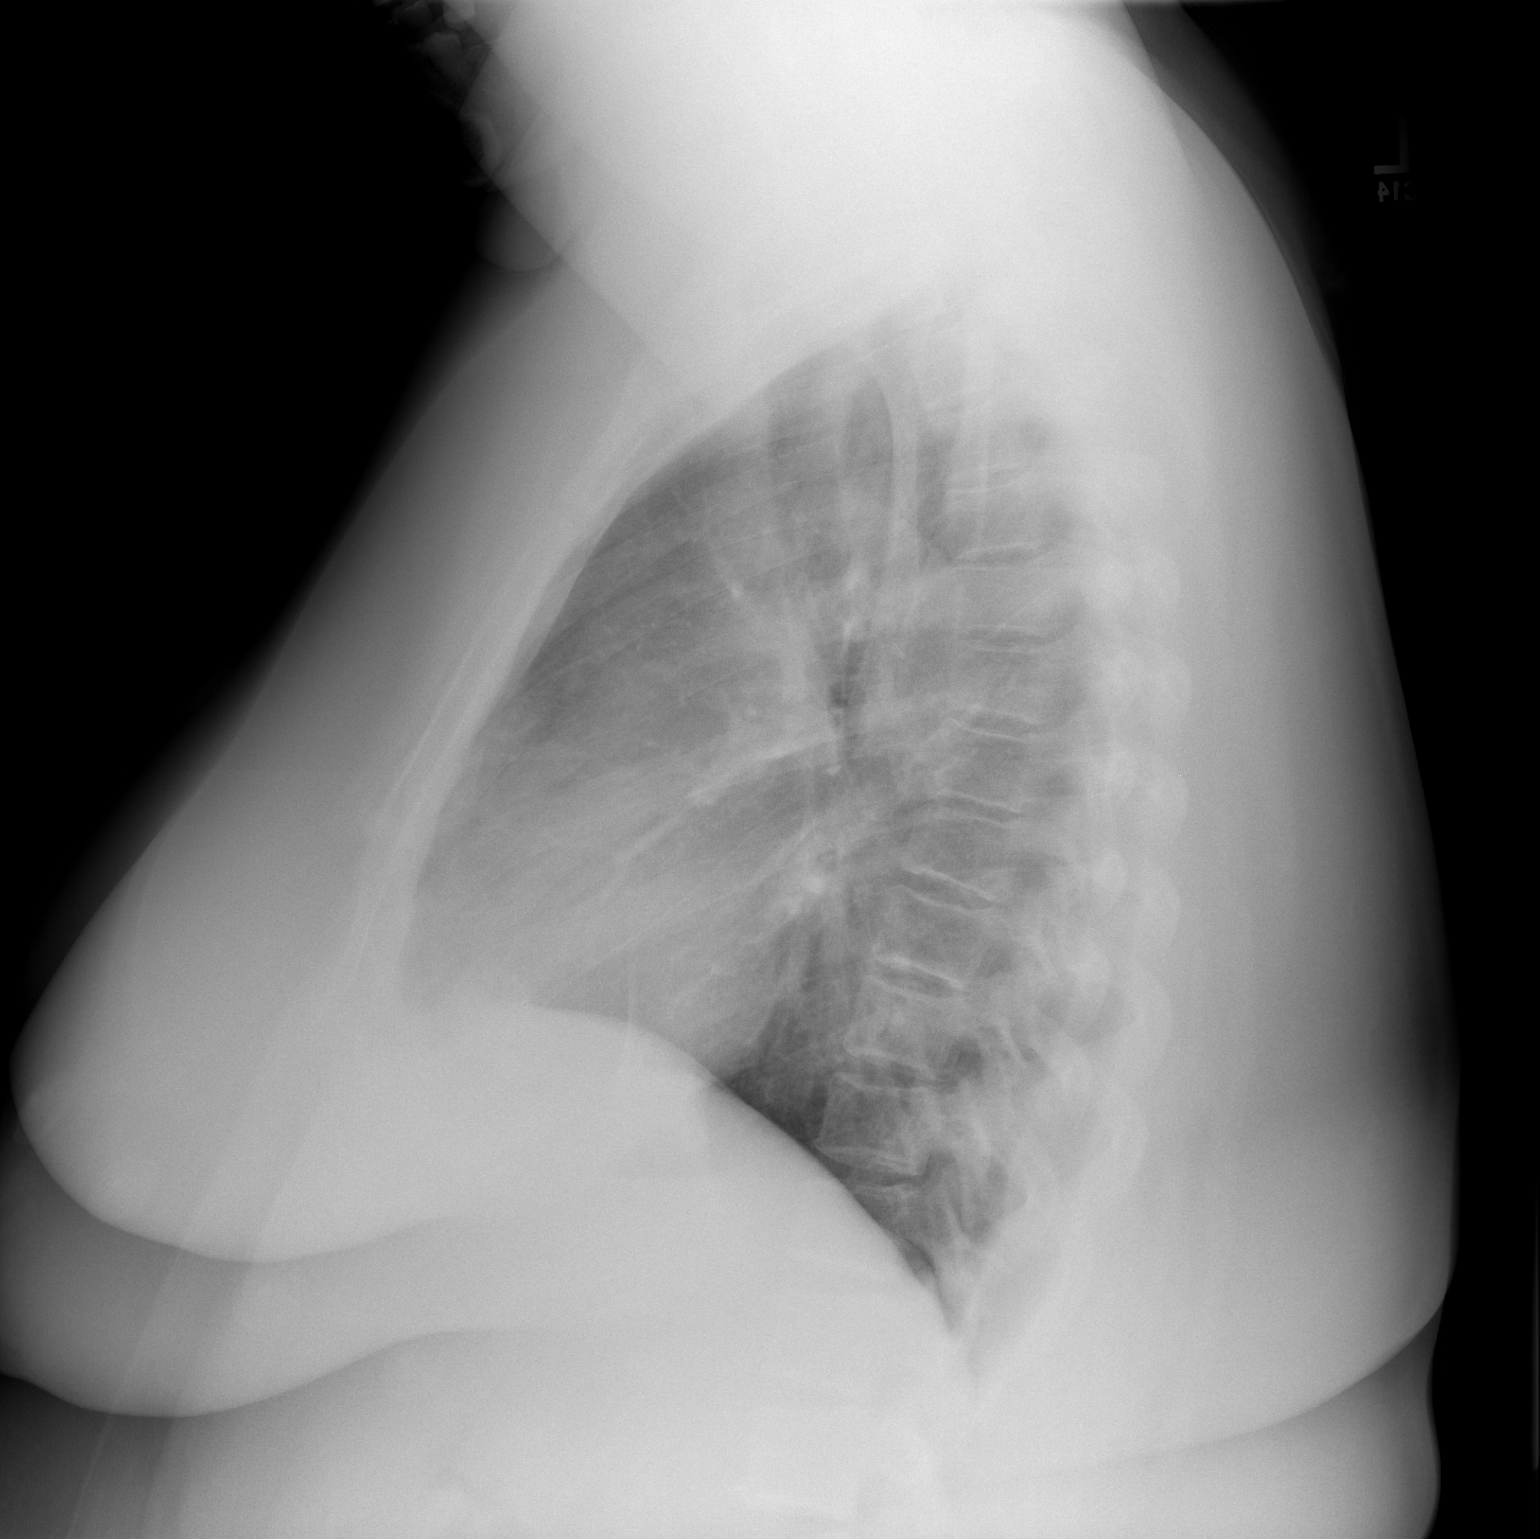

[2 of 2 positions shown; findings below may reference images not displayed]

FINDINGS: The heart size and mediastinal contours are within normal limits.
Both lungs are clear. The visualized skeletal structures are
unremarkable.
IMPRESSION: No active cardiopulmonary disease.

## 2021-12-16 ENCOUNTER — Encounter (HOSPITAL_BASED_OUTPATIENT_CLINIC_OR_DEPARTMENT_OTHER): Payer: Self-pay | Admitting: Emergency Medicine

## 2021-12-16 ENCOUNTER — Emergency Department (HOSPITAL_BASED_OUTPATIENT_CLINIC_OR_DEPARTMENT_OTHER)
Admission: EM | Admit: 2021-12-16 | Discharge: 2021-12-16 | Disposition: A | Discharge status: Home / Self Care | Payer: 59 | Attending: Emergency Medicine | Admitting: Emergency Medicine

## 2021-12-16 ENCOUNTER — Emergency Department (HOSPITAL_BASED_OUTPATIENT_CLINIC_OR_DEPARTMENT_OTHER): Payer: 59

## 2021-12-16 ENCOUNTER — Other Ambulatory Visit: Payer: Self-pay

## 2021-12-16 DIAGNOSIS — D72819 Decreased white blood cell count, unspecified: Secondary | ICD-10-CM | POA: Diagnosis not present

## 2021-12-16 DIAGNOSIS — E876 Hypokalemia: Secondary | ICD-10-CM | POA: Insufficient documentation

## 2021-12-16 DIAGNOSIS — R109 Unspecified abdominal pain: Secondary | ICD-10-CM | POA: Diagnosis not present

## 2021-12-16 LAB — URINALYSIS, ROUTINE W REFLEX MICROSCOPIC
Bilirubin Urine: NEGATIVE
Glucose, UA: NEGATIVE mg/dL
Hgb urine dipstick: NEGATIVE
Ketones, ur: NEGATIVE mg/dL
Leukocytes,Ua: NEGATIVE
Nitrite: NEGATIVE
Protein, ur: NEGATIVE mg/dL
Specific Gravity, Urine: >=1.03 (ref 1.005–1.030)
pH: 6.5 (ref 5.0–8.0)

## 2021-12-16 LAB — CBC WITH DIFFERENTIAL/PLATELET
Abs Immature Granulocytes: 0.01 10*3/uL (ref 0.00–0.07)
Basophils Absolute: 0 10*3/uL (ref 0.0–0.1)
Basophils Relative: 0 %
Eosinophils Absolute: 0.1 10*3/uL (ref 0.0–0.5)
Eosinophils Relative: 3 %
HCT: 38.1 % (ref 36.0–46.0)
Hemoglobin: 12.1 g/dL (ref 12.0–15.0)
Immature Granulocytes: 0 %
Lymphocytes Relative: 30 %
Lymphs Abs: 1.1 10*3/uL (ref 0.7–4.0)
MCH: 26.6 pg (ref 26.0–34.0)
MCHC: 31.8 g/dL (ref 30.0–36.0)
MCV: 83.7 fL (ref 80.0–100.0)
Monocytes Absolute: 0.4 10*3/uL (ref 0.1–1.0)
Monocytes Relative: 11 %
Neutro Abs: 2 10*3/uL (ref 1.7–7.7)
Neutrophils Relative %: 56 %
Platelets: 304 10*3/uL (ref 150–400)
RBC: 4.55 MIL/uL (ref 3.87–5.11)
RDW: 14.1 % (ref 11.5–15.5)
WBC: 3.5 10*3/uL — ABNORMAL LOW (ref 4.0–10.5)
nRBC: 0 % (ref 0.0–0.2)

## 2021-12-16 LAB — WET PREP, GENITAL
Clue Cells Wet Prep HPF POC: NONE SEEN
Sperm: NONE SEEN
Trich, Wet Prep: NONE SEEN
WBC, Wet Prep HPF POC: <10 (ref <10)
Yeast Wet Prep HPF POC: NONE SEEN

## 2021-12-16 LAB — COMPREHENSIVE METABOLIC PANEL
ALT: 17 U/L (ref 0–44)
AST: 17 U/L (ref 15–41)
Albumin: 3.6 g/dL (ref 3.5–5.0)
Alkaline Phosphatase: 77 U/L (ref 38–126)
Anion gap: 8 (ref 5–15)
BUN: 14 mg/dL (ref 6–20)
CO2: 25 mmol/L (ref 22–32)
Calcium: 8.7 mg/dL — ABNORMAL LOW (ref 8.9–10.3)
Chloride: 105 mmol/L (ref 98–111)
Creatinine, Ser: 0.69 mg/dL (ref 0.44–1.00)
GFR, Estimated: >60 mL/min (ref >60)
Glucose, Bld: 93 mg/dL (ref 70–99)
Potassium: 3.1 mmol/L — ABNORMAL LOW (ref 3.5–5.1)
Sodium: 138 mmol/L (ref 135–145)
Total Bilirubin: 0.7 mg/dL (ref 0.3–1.2)
Total Protein: 8.2 g/dL — ABNORMAL HIGH (ref 6.5–8.1)

## 2021-12-16 LAB — LIPASE, BLOOD: Lipase: 28 U/L (ref 11–51)

## 2021-12-16 LAB — PREGNANCY, URINE: Preg Test, Ur: NEGATIVE

## 2021-12-16 MED ORDER — POTASSIUM CHLORIDE CRYS ER 20 MEQ PO TBCR: 20.0000 meq | EXTENDED_RELEASE_TABLET | Freq: Two times a day (BID) | ORAL | 0 refills | Status: AC

## 2021-12-16 MED ORDER — KETOROLAC TROMETHAMINE 30 MG/ML IJ SOLN: 30.0000 mg | Freq: Once | INTRAMUSCULAR | Status: DC

## 2021-12-16 MED ORDER — KETOROLAC TROMETHAMINE 30 MG/ML IJ SOLN
30.0000 mg | Freq: Once | INTRAMUSCULAR | Status: AC
  Administered 2021-12-16: 30 mg via INTRAVENOUS
  Filled 2021-12-16: qty 1

## 2021-12-16 NOTE — ED Triage Notes (Signed)
Pt arrives pov, ambulatory with c/o lower abdominal pain and bilateral lower back pain x 1 week. Endorses concern for UTI, denies dysuria, denies fever, took AZO last night with some relief ?

## 2021-12-16 NOTE — ED Notes (Signed)
Began having lower abd pain last week, denies N/V/D or fever, pain moved to back. Having some urinary frequency, has not noted any odor to her urine as well. Has been taking in a lot of water and cranberry juice and stopped sodas.  ?

## 2021-12-16 NOTE — Discharge Instructions (Signed)
Schedule an appointment with your primary care doctor to follow-up regarding your symptoms.  I prescribed you a potassium supplement because your potassium was low on your labs today.  Please take this as prescribed.  You can have your PCP recheck your potassium when you go visit and they can decide whether they want to refill this. ?If your pain significantly worsens again, please return to the emergency department. ?

## 2021-12-16 NOTE — ED Provider Notes (Signed)
[facility] EMERGENCY DEPARTMENT Provider Note   CSN: [id number] Arrival date & time: 12/16/21  1032     History  Chief Complaint  Patient presents with   Abdominal Pain    Sara Porter is a 46 y.o. female.  Patient has a past medical history of bacterial vaginosis, tubal ligation, iron deficiency anemia.  Patient presents emergency department with chief complaint of pelvic pain.  She states that the pain started about 1 week ago.  She says it started in the middle of her pelvis and, wraps around to her back.  She says that the pain is changed positions multiple times throughout this week where it will sometimes be in her pelvis and then sometimes she experiences some left flank pain.  She says yesterday the pain got very very severe.  She does not think anything makes it better or worse.  She does not remember any inciting injury.  Pain has been constant and has waxed and waned in intensity.  She was concerned this was a UTI so she took some Azo and had some relief with that.  Today she presents for evaluation, however her symptoms are much more mild at this point.  Right now, her pain is mostly in her left flank.  She denies any fevers or chills, dysuria, hematuria.  She does have some vaginal discharge, but this is not unusual for her as she has chronic bacterial vaginosis.  She is sexually active with a single partner And is concerned that she should get checked for any cervical infection.   Abdominal Pain Associated symptoms: vaginal discharge   Associated symptoms: no constipation, no diarrhea, no dysuria, no fever, no hematuria, no nausea and no vomiting       Home Medications Prior to Admission medications   Medication Sig Start Date End Date Taking? Authorizing Provider  potassium chloride SA (KLOR-CON M) 20 MEQ tablet Take 1 tablet (20 mEq total) by mouth 2 (two) times daily. 12/16/21 01/15/22 Yes Payal Stanforth, Finis Bud, PA-C  celecoxib (CELEBREX) 200 MG capsule Take 1  capsule (200 mg total) by mouth 2 (two) times daily. 08/08/18   Jacalyn Lefevre, MD  famotidine (PEPCID) 20 MG tablet Take 1 tablet (20 mg total) by mouth 2 (two) times daily. 04/20/19   Dartha Lodge, PA-C  ibuprofen (ADVIL,MOTRIN) 600 MG tablet Take 1 tablet (600 mg total) by mouth every 8 (eight) hours as needed. Take with food. 07/06/18   Cathren Laine, MD  Ibuprofen-diphenhydrAMINE Cit 200-38 MG TABS Take by mouth.    [provider]  naproxen (NAPROSYN) 500 MG tablet Take 1 tablet (500 mg total) by mouth 2 (two) times daily. 04/14/20   Renne Crigler, PA-C  Omeprazole 20 MG TBEC Take 20 mg by mouth every morning.    [provider]      Allergies    Patient has no known allergies.    Review of Systems   Review of Systems  Constitutional:  Negative for fever.  Gastrointestinal:  Positive for abdominal pain. Negative for blood in stool, constipation, diarrhea, nausea and vomiting.  Genitourinary:  Positive for flank pain, pelvic pain and vaginal discharge. Negative for dysuria and hematuria.  All other systems reviewed and are negative.  Physical Exam Updated Vital Signs BP (!) 132/107 (BP Location: Right Arm)    Pulse 86    Temp 98.7 F (37.1 C) (Oral)    Resp 16    Ht 5\' 2"  (1.575 m)    Wt 128.4 kg  LMP 11/23/2021    SpO2 100%    BMI 51.76 kg/m  Physical Exam Vitals and nursing note reviewed.  Constitutional:      General: She is not in acute distress.    Appearance: Normal appearance. She is not ill-appearing, toxic-appearing or diaphoretic.  HENT:     Head: Normocephalic and atraumatic.     Nose: No nasal deformity.     Mouth/Throat:     Lips: Pink. No lesions.     Mouth: Mucous membranes are moist. No injury, lacerations, oral lesions or angioedema.     Pharynx: Oropharynx is clear. Uvula midline. No pharyngeal swelling, oropharyngeal exudate, posterior oropharyngeal erythema or uvula swelling.  Eyes:     General: Gaze aligned appropriately. No scleral  icterus.       Right eye: No discharge.        Left eye: No discharge.     Conjunctiva/sclera: Conjunctivae normal.     Right eye: Right conjunctiva is not injected. No exudate or hemorrhage.    Left eye: Left conjunctiva is not injected. No exudate or hemorrhage.    Pupils: Pupils are equal, round, and reactive to light.  Cardiovascular:     Rate and Rhythm: Normal rate and regular rhythm.     Pulses: Normal pulses.          Radial pulses are 2+ on the right side and 2+ on the left side.       Dorsalis pedis pulses are 2+ on the right side and 2+ on the left side.     Heart sounds: Normal heart sounds, S1 normal and S2 normal. Heart sounds not distant. No murmur heard.   No friction rub. No gallop. No S3 or S4 sounds.  Pulmonary:     Effort: Pulmonary effort is normal. No accessory muscle usage or respiratory distress.     Breath sounds: Normal breath sounds. No stridor. No wheezing, rhonchi or rales.  Chest:     Chest wall: No tenderness.  Abdominal:     General: Abdomen is flat. Bowel sounds are normal. There is no distension.     Palpations: Abdomen is soft. There is no mass or pulsatile mass.     Tenderness: There is no abdominal tenderness. There is left CVA tenderness. There is no right CVA tenderness, guarding or rebound.     Comments: There was not really any notable tenderness in the suprapubic region, right lower quadrant, or left lower quadrant.  Patient does have some left CVA tenderness that is mild.  There is no peritoneal signs, guarding, or rigidity.  Musculoskeletal:     Right lower leg: No edema.     Left lower leg: No edema.  Skin:    General: Skin is warm and dry.     Coloration: Skin is not jaundiced or pale.     Findings: No bruising, erythema, lesion or rash.  Neurological:     General: No focal deficit present.     Mental Status: She is alert and oriented to person, place, and time.     GCS: GCS eye subscore is 4. GCS verbal subscore is 5. GCS motor subscore  is 6.  Psychiatric:        Mood and Affect: Mood normal.        Behavior: Behavior normal. Behavior is cooperative.    ED Results / Procedures / Treatments   Labs (all labs ordered are listed, but only abnormal results are displayed) Labs Reviewed  CBC WITH DIFFERENTIAL/PLATELET - Abnormal;  Notable for the following components:      Result Value   WBC 3.5 (*)    All other components within normal limits  COMPREHENSIVE METABOLIC PANEL - Abnormal; Notable for the following components:   Potassium 3.1 (*)    Calcium 8.7 (*)    Total Protein 8.2 (*)    All other components within normal limits  WET PREP, GENITAL  URINALYSIS, ROUTINE W REFLEX MICROSCOPIC  LIPASE, BLOOD  PREGNANCY, URINE  GC/CHLAMYDIA PROBE AMP () NOT AT [facility]    EKG None  Radiology CT RENAL STONE STUDY  Result Date: 12/16/2021 CLINICAL DATA:  Bilateral low back pain for 1 week. EXAM: CT ABDOMEN AND PELVIS WITHOUT CONTRAST TECHNIQUE: Multidetector CT imaging of the abdomen and pelvis was performed following the standard protocol without IV contrast. RADIATION DOSE REDUCTION: This exam was performed according to the departmental dose-optimization program which includes automated exposure control, adjustment of the mA and/or kV according to patient size and/or use of iterative reconstruction technique. COMPARISON:  None. FINDINGS: Lower chest: No acute abnormality. Hepatobiliary: No focal liver abnormality is seen. No gallstones, gallbladder wall thickening, or biliary dilatation. Pancreas: Unremarkable. No pancreatic ductal dilatation or surrounding inflammatory changes. Spleen: Normal in size without focal abnormality. Adrenals/Urinary Tract: Adrenal glands are unremarkable. Kidneys are normal, without renal calculi, focal lesion, or hydronephrosis. Bladder is unremarkable. Stomach/Bowel: Stomach is within normal limits. No evidence of bowel wall thickening, distention, or inflammatory changes. Appendix is normal.  Diverticulosis without evidence of diverticulitis. Vascular/Lymphatic: No significant vascular findings are present. No enlarged abdominal or pelvic lymph nodes. Reproductive: Uterus and bilateral adnexa are unremarkable. Other: No abdominal wall hernia or abnormality. No abdominopelvic ascites. Musculoskeletal: No acute osseous abnormality. No aggressive osseous lesion. IMPRESSION: 1. No acute abdominal or pelvic pathology. 2. Diverticulosis without evidence of diverticulitis. Electronically Signed   By: Elige Ko M.D.   On: 12/16/2021 12:57    Procedures Procedures    Medications Ordered in ED Medications  ketorolac (TORADOL) 30 MG/ML injection 30 mg (30 mg Intravenous Given 12/16/21 1319)    ED Course/ Medical Decision Making/ A&P Clinical Course as of 12/16/21 1833  Sun Dec 16, 2021  1223 Potassium(!): 3.1 [GL]  1224 WBC(!): 3.5 [GL]    Clinical Course User Index [GL] Victorino Dike Finis Bud, PA-C                           Medical Decision Making Amount and/or Complexity of Data Reviewed Labs: ordered. Decision-making details documented in ED Course. Radiology: ordered.  Risk Prescription drug management.    MDM  This is a 46 y.o. female with a pertinent PMH of BV and tubal ligation who presents to the ED with pelvic pain that radiates to her left flank The differential of this patient includes but is not limited to Appendicitis, hernia, Diverticulitis, Ectopic Pregnancy, Ovarian Torsion, PID/TOA, Mittelschmerz, Period/Fibroids, UTI, Constipation, Epididymitis/Orchitis  My Impression, Plan, and ED Course:  Patient is well-appearing.  Her vitals are stable.  She is in no acute distress.  Her abdominal exam is pretty benign.  She does have some mild reproducible left flank pain.  I discussed with patient that she had normal urine with no evidence of infection.  It seems like her pain has improved immensely since yesterday.  She however, requests pelvic exam with STD testing to rule  out that as a cause of her pain.  She also wants to proceed with CT stone study, although  I informed her if she did have a stone is already passed may not be able to see this on the image.  Pelvic exam was unremarkable.  She does small amount of white discharge that did not seem copious in amount.  She did not have any tenderness of her cervix or adnexal area.  I personally ordered, reviewed, and interpreted all laboratory work and imaging and agree with radiologist interpretation. Results interpreted below: Potassium 3.1.  CMP otherwise unremarkable.  CBC unremarkable.  Lipase negative.  Pregnancy test negative.  Urine without any bacteria.  Wet prep is negative.  Gonorrhea chlamydia are pending.  CT stone Study did not reveal any acute pathology.  Overall, patient's work-up was pretty unremarkable.  Pain improved after Toradol.  I am reassured that pain has gotten better over the past couple of days rather than worse.  I do not have a high suspicion for PID or STD, so will not treat prophylactically right now.  We will follow-up on the results once they can return.  Pain could be due to a previous kidney stone that recently passed, however there is no evidence of hydronephrosis or any ureteral inflammation on the CT.  I also discussed the possibility of this being a musculoskeletal cause of her pain.  Recommended gentle stretching, heating pad, and over-the-counter anti-inflammatory medications to help with her symptoms.  Patient understands and feels reassured that everything was normal.  I want her to follow-up with her PCP.   Charting Requirements Additional history is obtained from:  Independent historian External Records from outside source obtained and reviewed including: Recent annual PE in 2022 Social Determinants of Health:  Poor health literacy Pertinant PMH that complicates patient's illness: hx of BV  Patient Care Problems that were addressed during this visit: - Pelvic/Flank pain:  Acute illness Medications given in ED: Toradol Reevaluation of the patient after these medicines showed that the patient stayed the same Disposition: supportive treatment at home  Portions of this note were generated with Dragon dictation software. Dictation errors may occur despite best attempts at proofreading.     Final Clinical Impression(s) / ED Diagnoses Final diagnoses:  Left flank pain  Hypokalemia    Rx / DC Orders ED Discharge Orders          Ordered    potassium chloride SA (KLOR-CON M) 20 MEQ tablet  2 times daily        12/16/21 1409              Dredyn Gubbels, Finis BudGrace C, PA-C 12/16/21 Berna [id number]    Pricilla LovelessGoldston, Scott, MD 12/17/21 1112

## 2021-12-16 NOTE — ED Notes (Signed)
ED Provider at bedside. 

## 2021-12-17 LAB — GC/CHLAMYDIA PROBE AMP (~~LOC~~) NOT AT ARMC
Chlamydia: NEGATIVE
Comment: NEGATIVE
Comment: NORMAL
Neisseria Gonorrhea: NEGATIVE

## 2022-04-04 ENCOUNTER — Emergency Department (HOSPITAL_BASED_OUTPATIENT_CLINIC_OR_DEPARTMENT_OTHER)
Admission: EM | Admit: 2022-04-04 | Discharge: 2022-04-04 | Disposition: A | Discharge status: Home / Self Care | Payer: Self-pay | Attending: Emergency Medicine | Admitting: Emergency Medicine

## 2022-04-04 ENCOUNTER — Encounter (HOSPITAL_BASED_OUTPATIENT_CLINIC_OR_DEPARTMENT_OTHER): Payer: Self-pay | Admitting: Pediatrics

## 2022-04-04 ENCOUNTER — Other Ambulatory Visit: Payer: Self-pay

## 2022-04-04 ENCOUNTER — Emergency Department (HOSPITAL_BASED_OUTPATIENT_CLINIC_OR_DEPARTMENT_OTHER): Payer: Self-pay

## 2022-04-04 DIAGNOSIS — W19XXXA Unspecified fall, initial encounter: Secondary | ICD-10-CM

## 2022-04-04 DIAGNOSIS — S8011XA Contusion of right lower leg, initial encounter: Secondary | ICD-10-CM | POA: Insufficient documentation

## 2022-04-04 DIAGNOSIS — W108XXA Fall (on) (from) other stairs and steps, initial encounter: Secondary | ICD-10-CM | POA: Insufficient documentation

## 2022-04-04 DIAGNOSIS — M79604 Pain in right leg: Secondary | ICD-10-CM

## 2022-04-04 DIAGNOSIS — S5001XA Contusion of right elbow, initial encounter: Secondary | ICD-10-CM | POA: Insufficient documentation

## 2022-04-04 NOTE — Discharge Instructions (Signed)
Please use Tylenol or ibuprofen for pain.  You may use 600 mg ibuprofen every 6 hours or 1000 mg of Tylenol every 6 hours.  You may choose to alternate between the 2.  This would be most effective.  Not to exceed 4 g of Tylenol within 24 hours.  Not to exceed 3200 mg ibuprofen 24 hours.  You can rest, ice, use compression and elevation to the affected extremity. I recommend you follow up with orthopedics if you have ongoing pain despite treatment.

## 2022-04-04 NOTE — ED Triage Notes (Signed)
Reported fall last Sunday on top of 5 step stairs; and used left arm to catch the fall; c/o right leg numbness, concern for blood clots; + bruising on right elbow; ambulatory in triage;

## 2022-04-05 ENCOUNTER — Ambulatory Visit: Payer: Self-pay

## 2022-04-05 ENCOUNTER — Ambulatory Visit (INDEPENDENT_AMBULATORY_CARE_PROVIDER_SITE_OTHER): Payer: Self-pay | Admitting: Family Medicine

## 2022-04-05 ENCOUNTER — Encounter: Payer: Self-pay | Admitting: Family Medicine

## 2022-04-05 VITALS — BP 148/90 | Ht 62.0 in | Wt 280.0 lb

## 2022-04-05 DIAGNOSIS — M7041 Prepatellar bursitis, right knee: Secondary | ICD-10-CM

## 2022-04-05 DIAGNOSIS — S8011XA Contusion of right lower leg, initial encounter: Secondary | ICD-10-CM

## 2022-04-05 DIAGNOSIS — M25461 Effusion, right knee: Secondary | ICD-10-CM | POA: Insufficient documentation

## 2022-04-05 DIAGNOSIS — M79604 Pain in right leg: Secondary | ICD-10-CM

## 2022-04-05 NOTE — Assessment & Plan Note (Signed)
Initial injury on 6/18.  She likely has a bone bruise associated.  A fairly large hematoma that is overlying the insertion of the patellar tendon. -Counseled on home exercise therapy and supportive care. -Aspiration today. -Follow-up in 1 week.

## 2022-04-11 ENCOUNTER — Encounter: Payer: Self-pay | Admitting: Family Medicine

## 2022-04-11 ENCOUNTER — Ambulatory Visit (INDEPENDENT_AMBULATORY_CARE_PROVIDER_SITE_OTHER): Payer: Self-pay | Admitting: Family Medicine

## 2022-04-11 VITALS — BP 130/90 | Ht 62.0 in | Wt 280.0 lb

## 2022-04-11 DIAGNOSIS — M25461 Effusion, right knee: Secondary | ICD-10-CM

## 2022-04-11 DIAGNOSIS — S8011XA Contusion of right lower leg, initial encounter: Secondary | ICD-10-CM

## 2022-04-11 MED ORDER — PREDNISONE 5 MG PO TABS: ORAL_TABLET | ORAL | 0 refills | Status: AC

## 2022-04-11 NOTE — Assessment & Plan Note (Signed)
Having some minor pain at the knee but has good range of motion.  May have exacerbated component of her arthritis with the trauma. -Counseled on home exercise therapy and supportive care. -Prednisone. -Could consider physical therapy or injection.

## 2022-04-11 NOTE — Progress Notes (Signed)
  Sara Porter - 46 y.o. female MRN [id number]  Date of birth: 09-29-1976  SUBJECTIVE:  Including CC & ROS.  No chief complaint on file.   Sara Porter is a 46 y.o. female that is following up for her right hematoma of the right leg and right knee pain.  The hematoma has decreased in volume but still having pain overlying the hematoma in the.   Review of Systems See HPI   HISTORY: Past Medical, Surgical, Social, and Family History Reviewed & Updated per EMR.   Pertinent Historical Findings include:  Past Medical History:  Diagnosis Date   Anemia    BV (bacterial vaginosis)    Herpes    Menorrhagia     Past Surgical History:  Procedure Laterality Date   TUBAL LIGATION       PHYSICAL EXAM:  VS: BP 130/90 (BP Location: Left Arm, Patient Position: Sitting)   Ht 5\' 2"  (1.575 m)   Wt 280 lb (127 kg)   BMI 51.21 kg/m  Physical Exam Gen: NAD, alert, cooperative with exam, well-appearing MSK:  Neurovascularly intact       ASSESSMENT & PLAN:   Hematoma of right lower leg Showing improvement since the aspiration and irrigation.  The area is smaller in size and no signs of infection. -Counseled on home exercise therapy and supportive care. -Continue heat and compression. -Follow-up in 3 weeks.  Knee effusion, right Having some minor pain at the knee but has good range of motion.  May have exacerbated component of her arthritis with the trauma. -Counseled on home exercise therapy and supportive care. -Prednisone. -Could consider physical therapy or injection.

## 2022-04-11 NOTE — Assessment & Plan Note (Signed)
Showing improvement since the aspiration and irrigation.  The area is smaller in size and no signs of infection. -Counseled on home exercise therapy and supportive care. -Continue heat and compression. -Follow-up in 3 weeks.

## 2022-04-11 NOTE — Patient Instructions (Signed)
Good to see you Please continue heat and compression  Please try to elevate your leg  Please send me a message in MyChart with any questions or updates.  Please see me back in 3-4 weeks.   --Dr. Jordan Likes

## 2022-05-13 ENCOUNTER — Ambulatory Visit: Payer: Self-pay | Admitting: Family Medicine

## 2023-01-27 ENCOUNTER — Encounter: Payer: Self-pay | Admitting: *Deleted
